# Patient Record
Sex: Female | Born: 1950 | Race: White | Hispanic: No | Smoking: Former smoker
Health system: Southern US, Community
[De-identification: ages and names within clinical notes are randomized; demographics above are authoritative.]

## PROBLEM LIST (undated history)

## (undated) DIAGNOSIS — F329 Major depressive disorder, single episode, unspecified: Secondary | ICD-10-CM

## (undated) DIAGNOSIS — F32A Depression, unspecified: Secondary | ICD-10-CM

## (undated) DIAGNOSIS — J449 Chronic obstructive pulmonary disease, unspecified: Secondary | ICD-10-CM

## (undated) DIAGNOSIS — I219 Acute myocardial infarction, unspecified: Secondary | ICD-10-CM

## (undated) DIAGNOSIS — D649 Anemia, unspecified: Secondary | ICD-10-CM

## (undated) DIAGNOSIS — F419 Anxiety disorder, unspecified: Secondary | ICD-10-CM

## (undated) DIAGNOSIS — I10 Essential (primary) hypertension: Secondary | ICD-10-CM

## (undated) HISTORY — DX: Essential (primary) hypertension: I10

## (undated) HISTORY — DX: Depression, unspecified: F32.A

## (undated) HISTORY — DX: Anxiety disorder, unspecified: F41.9

## (undated) HISTORY — DX: Acute myocardial infarction, unspecified: I21.9

## (undated) HISTORY — DX: Anemia, unspecified: D64.9

## (undated) HISTORY — DX: Chronic obstructive pulmonary disease, unspecified: J44.9

---
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
## 2019-02-12 ENCOUNTER — Encounter (INDEPENDENT_AMBULATORY_CARE_PROVIDER_SITE_OTHER): Payer: Self-pay

## 2019-02-12 ENCOUNTER — Other Ambulatory Visit: Payer: Self-pay

## 2019-02-12 ENCOUNTER — Encounter: Payer: Self-pay | Admitting: Nurse Practitioner

## 2019-02-12 ENCOUNTER — Ambulatory Visit (INDEPENDENT_AMBULATORY_CARE_PROVIDER_SITE_OTHER): Payer: Medicare Other | Admitting: Nurse Practitioner

## 2019-02-12 VITALS — BP 108/60 | HR 62 | Temp 98.2°F | Ht 58.5 in | Wt 138.0 lb

## 2019-02-12 DIAGNOSIS — G894 Chronic pain syndrome: Secondary | ICD-10-CM

## 2019-02-12 DIAGNOSIS — R5381 Other malaise: Secondary | ICD-10-CM

## 2019-02-12 DIAGNOSIS — F419 Anxiety disorder, unspecified: Secondary | ICD-10-CM

## 2019-02-12 DIAGNOSIS — I10 Essential (primary) hypertension: Secondary | ICD-10-CM

## 2019-02-12 DIAGNOSIS — I25119 Atherosclerotic heart disease of native coronary artery with unspecified angina pectoris: Secondary | ICD-10-CM | POA: Diagnosis not present

## 2019-02-12 DIAGNOSIS — J449 Chronic obstructive pulmonary disease, unspecified: Secondary | ICD-10-CM

## 2019-02-12 DIAGNOSIS — D508 Other iron deficiency anemias: Secondary | ICD-10-CM

## 2019-02-12 DIAGNOSIS — R634 Abnormal weight loss: Secondary | ICD-10-CM

## 2019-02-12 NOTE — Patient Instructions (Addendum)
To titrate off hydrocodone over the next 2 weeks To use tylenol 325 2 tablets every 6 hours as needed for pain  To decrease buspar to daily for 3 days then stop To call therapist/counselor   Follow up in 2 weeks  Make sure to sign record release from previous PCP

## 2019-02-12 NOTE — Progress Notes (Signed)
Careteam: Patient Care Team: System, Pcp Not In as PCP - General  Advanced Directive information    No Known Allergies  Chief Complaint  Patient presents with  . Establish Care    New patient establish care. Patient c/o sinus issues. Possible kidney infection, pressure when urinating, and pelvic pain. Patient tried AZO. Here with daughter Herbert SetaHeather   . Shortness of Breath    Patient c/o breathing issues, anxiety, and hullations   . Medication Management    Not sure of Spiriva dose   . Referral    Need orders for [facility]      HPI: Patient is a 68 y.o. female seen in the office today to establish care  CAD/ htn/recently hospitalized for MI and had stents 01/20/2019, continues ASA 81 mg daily, lipitor 80 mg by mouth daily which was recently increased. Continues on metoprolol 25 mg BID, ticarelor 90 mg twice daily- also plans to keep going to [place] for her specialist. Having weakness and increase generalized pain.   Advanced COPD- following with pulmonary doctor in [place]  does not plan on establishing with provider here. Continues on symbicort 2 puffs twice daily with spiriva daily. Maintained on 4L [place]  Panic attack/anxiety- anxiety not controlled, taking lexapro 20 mg daily and buspar 7.5 mg twice daily.  Has been on lexapro for about 4 years and buspar started 7/19 she was previously on clonipine.  Does not feel like the buspar is offering any benefit.  resp therapist have given deep breathing exercises to her in the hospital which was beneficial   Anemia- currently taking iron supplement twice daily - poor dietary intake.   GERD- stable on protonix    taking hydrocodone- APAP 1 tablet every 12 hours as needed for pain. Takes routinely twice daily and feels like she needs it more for her breathing. daughter reports family hx of addiction and feeling like they need a lot of medication. She has "generalized pain" but can not say where she is hurting exactly.   Daughter feels like she is between a rock and a hard place, her biggest concern is that she is on too many medication to depress her breathing and does not need the hydrocodone.  Reports she had pressure when urinating several days ago but this has resolved. No pain, frequency or urgency noted.   Review of Systems:  Review of Systems  Constitutional: Positive for malaise/fatigue and weight loss. Negative for chills and fever.  HENT: Positive for hearing loss. Negative for tinnitus.   Respiratory: Positive for shortness of breath and wheezing. Negative for cough and sputum production.   Cardiovascular: Negative for chest pain, palpitations and leg swelling.  Gastrointestinal: Negative for abdominal pain, constipation, diarrhea and heartburn.  Genitourinary: Negative for dysuria, frequency and urgency.  Musculoskeletal: Positive for myalgias. Negative for back pain, falls and joint pain.  Skin: Negative.   Neurological: Negative for dizziness and headaches.  Psychiatric/Behavioral: Positive for depression. Negative for memory loss. The patient is nervous/anxious. The patient does not have insomnia.     Past Medical History:  Diagnosis Date  . Anemia   . Anxiety   . COPD (chronic obstructive pulmonary disease) (HCC)    Stage 4  . Depression   . Heart attack (HCC)   . High blood pressure    History reviewed. No pertinent surgical history. Social History:   reports that she has quit smoking. Her smoking use included cigarettes. She has a 30.00 pack-year smoking history. She has never  used smokeless tobacco. She reports previous alcohol use of about 4.0 - 5.0 standard drinks of alcohol per week. She reports that she does not use drugs.  History reviewed. No pertinent family history.  Medications: Patient's Medications  New Prescriptions   No medications on file  Previous Medications   ASPIRIN EC 81 MG TABLET    Take 81 mg by mouth daily.   ATORVASTATIN (LIPITOR) 80 MG TABLET     Take 80 mg by mouth at bedtime.   BUDESONIDE-FORMOTEROL (SYMBICORT) 160-4.5 MCG/ACT INHALER    Inhale 2 puffs into the lungs 2 (two) times daily.   BUSPIRONE (BUSPAR) 7.5 MG TABLET    Take 7.5 mg by mouth 2 (two) times daily.   ESCITALOPRAM (LEXAPRO) 20 MG TABLET    Take 20 mg by mouth daily.   FERROUS SULFATE 325 (65 FE) MG EC TABLET    Take 325 mg by mouth 2 (two) times daily.   HYDROCODONE-ACETAMINOPHEN (NORCO/VICODIN) 5-325 MG TABLET    Take 1 tablet by mouth 2 (two) times daily.   METOPROLOL SUCCINATE (TOPROL-XL) 25 MG 24 HR TABLET    Take 25 mg by mouth 2 (two) times daily.   OXYGEN    Inhale 4 L into the lungs continuous. On x 5-6 years as of 2020   PANTOPRAZOLE (PROTONIX) 40 MG TABLET    Take 40 mg by mouth daily.   TICAGRELOR (BRILINTA) 90 MG TABS TABLET    Take 90 mg by mouth 2 (two) times daily.   TIOTROPIUM BROMIDE MONOHYDRATE (SPIRIVA HANDIHALER IN)    Inhale into the lungs.   VITAMIN C (ASCORBIC ACID) 500 MG TABLET    Take 500 mg by mouth daily.  Modified Medications   No medications on file  Discontinued Medications   No medications on file    Physical Exam:  Vitals:   02/12/19 1406  BP: 108/60  Pulse: 62  Temp: 98.2 F (36.8 C)  TempSrc: Oral  Weight: 138 lb (62.6 kg)  Height: 4' 10.5" (1.486 m)   Body mass index is 28.35 kg/m. Wt Readings from Last 3 Encounters:  02/12/19 138 lb (62.6 kg)    Physical Exam Constitutional:      General: She is not in acute distress.    Appearance: She is well-developed. She is not diaphoretic.  HENT:     Head: Normocephalic and atraumatic.  Eyes:     Conjunctiva/sclera: Conjunctivae normal.     Pupils: Pupils are equal, round, and reactive to light.  Neck:     Musculoskeletal: Normal range of motion and neck supple.  Cardiovascular:     Rate and Rhythm: Normal rate and regular rhythm.     Heart sounds: Normal heart sounds.  Pulmonary:     Effort: Pulmonary effort is normal.     Breath sounds: Decreased breath sounds  present.  Abdominal:     General: Bowel sounds are normal.     Palpations: Abdomen is soft.  Musculoskeletal:        General: No tenderness.     Right lower leg: No edema.     Left lower leg: No edema.  Skin:    General: Skin is warm and dry.  Neurological:     Mental Status: She is alert and oriented to person, place, and time.     Labs reviewed: Basic Metabolic Panel: No results for input(s): NA, K, CL, CO2, GLUCOSE, BUN, CREATININE, CALCIUM, MG, PHOS, TSH in the last 8760 hours. Liver Function Tests: No results for  input(s): AST, ALT, ALKPHOS, BILITOT, PROT, ALBUMIN in the last 8760 hours. No results for input(s): LIPASE, AMYLASE in the last 8760 hours. No results for input(s): AMMONIA in the last 8760 hours. CBC: No results for input(s): WBC, NEUTROABS, HGB, HCT, MCV, PLT in the last 8760 hours. Lipid Panel: No results for input(s): CHOL, HDL, LDLCALC, TRIG, CHOLHDL, LDLDIRECT in the last 8760 hours. TSH: No results for input(s): TSH in the last 8760 hours. A1C: No results found for: HGBA1C   Assessment/Plan 1. Coronary artery disease involving native heart with angina pectoris, unspecified vessel or lesion type [facility]) Recent MI with stents last month, after hospitalization daughter realized she could not take care of herself and has moved her to [employer]. She plans to keep follow up with former cardiologist  -continues on ASA, metoprolol succinate, brilinta and lipitor - Ambulatory referral to [place] GFR - CBC with Differential/Platelet - TSH  2. Debility - Ambulatory referral to Home Health  3. Essential hypertension -controlled on current regimen - Ambulatory referral to [place]  4. Stage 4 very severe COPD by GOLD classification ([place]) Instructed to use spiriva and symbicort as prescribed  5. Anxiety -not controlled. She was taken off klonopin abruptly after recent hospitalization. Daughter states she has had some  withdrawal from this. She was started on buspar which family and pt feel like is not beneficial. To decrease this to daily for 1 week then stop.  Will likely need to change lexapro but will address this in 2 weeks. -counseling services recommended and information provided.  - COMPLETE METABOLIC PANEL WITH GFR - CBC with Differential/Platelet - TSH  6. Weight loss Weight loss noted, she does not eat a significant amount of calories and does not like nutritional supplements, now living with daughter who is encouraging her to eat better. Can also try resource nutritional supplement which is more juice vs milk based.   7. Chronic pain syndrome Generalized pain however does not have a specific joint or muscle that hurts. States she takes hydrocodone/apap due to "lung pain" due to her severe COPD and risk for respiratory depression will titration hydrocodone/apap, encouraged to cut back to 1 tablet daily x 1 week then to decrease to 1/2 tablet daily for 4 days then to take every other day for 4 days then stop.  May use tylenol 650 mg by mouth every 6 hours as needed   8. Iron deficiency anemia secondary to inadequate dietary iron intake -continues on iron supplement, will follow up CBC -continues on iron.   Next appt: 2 weeks, sooner if needed [place] Claud K. [place], [facility] [phone]

## 2019-02-13 ENCOUNTER — Other Ambulatory Visit: Payer: Self-pay

## 2019-02-13 ENCOUNTER — Inpatient Hospital Stay (HOSPITAL_COMMUNITY)
Admission: EM | Admit: 2019-02-13 | Discharge: 2019-02-15 | DRG: 191 | Disposition: A | Discharge status: Home Health | Payer: Medicare Other | Attending: Student | Admitting: Student

## 2019-02-13 ENCOUNTER — Emergency Department (HOSPITAL_COMMUNITY): Payer: Medicare Other

## 2019-02-13 ENCOUNTER — Encounter (HOSPITAL_COMMUNITY): Payer: Self-pay

## 2019-02-13 DIAGNOSIS — Z7982 Long term (current) use of aspirin: Secondary | ICD-10-CM

## 2019-02-13 DIAGNOSIS — I252 Old myocardial infarction: Secondary | ICD-10-CM

## 2019-02-13 DIAGNOSIS — F419 Anxiety disorder, unspecified: Secondary | ICD-10-CM | POA: Diagnosis present

## 2019-02-13 DIAGNOSIS — R040 Epistaxis: Secondary | ICD-10-CM | POA: Diagnosis not present

## 2019-02-13 DIAGNOSIS — E785 Hyperlipidemia, unspecified: Secondary | ICD-10-CM

## 2019-02-13 DIAGNOSIS — D649 Anemia, unspecified: Secondary | ICD-10-CM

## 2019-02-13 DIAGNOSIS — I444 Left anterior fascicular block: Secondary | ICD-10-CM | POA: Diagnosis present

## 2019-02-13 DIAGNOSIS — G4733 Obstructive sleep apnea (adult) (pediatric): Secondary | ICD-10-CM | POA: Diagnosis present

## 2019-02-13 DIAGNOSIS — I10 Essential (primary) hypertension: Secondary | ICD-10-CM | POA: Diagnosis present

## 2019-02-13 DIAGNOSIS — J9610 Chronic respiratory failure, unspecified whether with hypoxia or hypercapnia: Secondary | ICD-10-CM | POA: Diagnosis present

## 2019-02-13 DIAGNOSIS — I2510 Atherosclerotic heart disease of native coronary artery without angina pectoris: Secondary | ICD-10-CM

## 2019-02-13 DIAGNOSIS — Z955 Presence of coronary angioplasty implant and graft: Secondary | ICD-10-CM

## 2019-02-13 DIAGNOSIS — G4730 Sleep apnea, unspecified: Secondary | ICD-10-CM

## 2019-02-13 DIAGNOSIS — K219 Gastro-esophageal reflux disease without esophagitis: Secondary | ICD-10-CM | POA: Diagnosis present

## 2019-02-13 DIAGNOSIS — R509 Fever, unspecified: Secondary | ICD-10-CM

## 2019-02-13 DIAGNOSIS — E876 Hypokalemia: Secondary | ICD-10-CM

## 2019-02-13 DIAGNOSIS — D509 Iron deficiency anemia, unspecified: Secondary | ICD-10-CM | POA: Diagnosis present

## 2019-02-13 DIAGNOSIS — Z9981 Dependence on supplemental oxygen: Secondary | ICD-10-CM

## 2019-02-13 DIAGNOSIS — Z87891 Personal history of nicotine dependence: Secondary | ICD-10-CM

## 2019-02-13 DIAGNOSIS — F329 Major depressive disorder, single episode, unspecified: Secondary | ICD-10-CM | POA: Diagnosis present

## 2019-02-13 DIAGNOSIS — Z7951 Long term (current) use of inhaled steroids: Secondary | ICD-10-CM

## 2019-02-13 DIAGNOSIS — J441 Chronic obstructive pulmonary disease with (acute) exacerbation: Principal | ICD-10-CM

## 2019-02-13 DIAGNOSIS — D539 Nutritional anemia, unspecified: Secondary | ICD-10-CM | POA: Diagnosis present

## 2019-02-13 DIAGNOSIS — Z20828 Contact with and (suspected) exposure to other viral communicable diseases: Secondary | ICD-10-CM | POA: Diagnosis present

## 2019-02-13 DIAGNOSIS — R911 Solitary pulmonary nodule: Secondary | ICD-10-CM | POA: Diagnosis present

## 2019-02-13 DIAGNOSIS — Z79899 Other long term (current) drug therapy: Secondary | ICD-10-CM

## 2019-02-13 LAB — BASIC METABOLIC PANEL
Anion gap: 10 (ref 5–15)
BUN: 13 mg/dL (ref 8–23)
CO2: 38 mmol/L — ABNORMAL HIGH (ref 22–32)
Calcium: 9 mg/dL (ref 8.9–10.3)
Chloride: 94 mmol/L — ABNORMAL LOW (ref 98–111)
Creatinine, Ser: 0.65 mg/dL (ref 0.44–1.00)
GFR calc Af Amer: >60 mL/min (ref >60)
GFR calc non Af Amer: >60 mL/min (ref >60)
Glucose, Bld: 146 mg/dL — ABNORMAL HIGH (ref 70–99)
Potassium: 3.2 mmol/L — ABNORMAL LOW (ref 3.5–5.1)
Sodium: 142 mmol/L (ref 135–145)

## 2019-02-13 LAB — COMPLETE METABOLIC PANEL WITH GFR
AG Ratio: 1.4 (calc) (ref 1.0–2.5)
ALT: 16 U/L (ref 6–29)
AST: 17 U/L (ref 10–35)
Albumin: 3.6 g/dL (ref 3.6–5.1)
Alkaline phosphatase (APISO): 84 U/L (ref 37–153)
BUN: 15 mg/dL (ref 7–25)
CO2: 37 mmol/L — ABNORMAL HIGH (ref 20–32)
Calcium: 9.2 mg/dL (ref 8.6–10.4)
Chloride: 94 mmol/L — ABNORMAL LOW (ref 98–110)
Creat: 0.68 mg/dL (ref 0.50–0.99)
GFR, Est African American: 104 mL/min/{1.73_m2} (ref >=60)
GFR, Est Non African American: 90 mL/min/{1.73_m2} (ref >=60)
Globulin: 2.5 g/dL (calc) (ref 1.9–3.7)
Glucose, Bld: 129 mg/dL — ABNORMAL HIGH (ref 65–99)
Potassium: 3.7 mmol/L (ref 3.5–5.3)
Sodium: 140 mmol/L (ref 135–146)
Total Bilirubin: 0.7 mg/dL (ref 0.2–1.2)
Total Protein: 6.1 g/dL (ref 6.1–8.1)

## 2019-02-13 LAB — CBC WITH DIFFERENTIAL/PLATELET
Abs Immature Granulocytes: 0.05 10*3/uL (ref 0.00–0.07)
Absolute Monocytes: 801 cells/uL (ref 200–950)
Basophils Absolute: 23 cells/uL (ref 0–200)
Basophils Absolute: 0 10*3/uL (ref 0.0–0.1)
Basophils Relative: 0.3 %
Basophils Relative: 0 %
Eosinophils Absolute: 23 cells/uL (ref 15–500)
Eosinophils Absolute: 0 10*3/uL (ref 0.0–0.5)
Eosinophils Relative: 0.3 %
Eosinophils Relative: 0 %
HCT: 29.2 % — ABNORMAL LOW (ref 35.0–45.0)
HCT: 31.4 % — ABNORMAL LOW (ref 36.0–46.0)
Hemoglobin: 8.9 g/dL — ABNORMAL LOW (ref 11.7–15.5)
Hemoglobin: 8.9 g/dL — ABNORMAL LOW (ref 12.0–15.0)
Immature Granulocytes: 1 %
Lymphocytes Relative: 4 %
Lymphs Abs: 285 cells/uL — ABNORMAL LOW (ref 850–3900)
Lymphs Abs: 0.3 10*3/uL — ABNORMAL LOW (ref 0.7–4.0)
MCH: 28.3 pg (ref 27.0–33.0)
MCH: 28.6 pg (ref 26.0–34.0)
MCHC: 30.5 g/dL — ABNORMAL LOW (ref 32.0–36.0)
MCHC: 28.3 g/dL — ABNORMAL LOW (ref 30.0–36.0)
MCV: 93 fL (ref 80.0–100.0)
MCV: 101 fL — ABNORMAL HIGH (ref 80.0–100.0)
MPV: 11.5 fL (ref 7.5–12.5)
Monocytes Absolute: 0.7 10*3/uL (ref 0.1–1.0)
Monocytes Relative: 10.4 %
Monocytes Relative: 9 %
Neutro Abs: 6568 cells/uL (ref 1500–7800)
Neutro Abs: 6.8 10*3/uL (ref 1.7–7.7)
Neutrophils Relative %: 85.3 %
Neutrophils Relative %: 86 %
Platelets: 323 10*3/uL (ref 140–400)
Platelets: 290 10*3/uL (ref 150–400)
RBC: 3.14 10*6/uL — ABNORMAL LOW (ref 3.80–5.10)
RBC: 3.11 MIL/uL — ABNORMAL LOW (ref 3.87–5.11)
RDW: 14.7 % (ref 11.0–15.0)
RDW: 16.3 % — ABNORMAL HIGH (ref 11.5–15.5)
Total Lymphocyte: 3.7 %
WBC: 7.7 10*3/uL (ref 3.8–10.8)
WBC: 7.9 10*3/uL (ref 4.0–10.5)
nRBC: 0 % (ref 0.0–0.2)

## 2019-02-13 LAB — BRAIN NATRIURETIC PEPTIDE: B Natriuretic Peptide: 50.8 pg/mL (ref 0.0–100.0)

## 2019-02-13 LAB — PROTIME-INR
INR: 0.9 (ref 0.8–1.2)
Prothrombin Time: 11.8 seconds (ref 11.4–15.2)

## 2019-02-13 LAB — TROPONIN I (HIGH SENSITIVITY): Troponin I (High Sensitivity): 12 ng/L (ref <18)

## 2019-02-13 LAB — SARS CORONAVIRUS 2 BY RT PCR (HOSPITAL ORDER, PERFORMED IN ~~LOC~~ HOSPITAL LAB): SARS Coronavirus 2: NEGATIVE

## 2019-02-13 LAB — TSH: TSH: 0.94 mIU/L (ref 0.40–4.50)

## 2019-02-13 MED ORDER — OXYMETAZOLINE HCL 0.05 % NA SOLN
1.0000 | Freq: Once | NASAL | Status: AC
  Administered 2019-02-13: 1 via NASAL

## 2019-02-13 MED ORDER — IPRATROPIUM BROMIDE HFA 17 MCG/ACT IN AERS
2.0000 | INHALATION_SPRAY | Freq: Once | RESPIRATORY_TRACT | Status: AC
  Administered 2019-02-13: 2 via RESPIRATORY_TRACT
  Filled 2019-02-13: qty 12.9

## 2019-02-13 MED ORDER — TRANEXAMIC ACID 1000 MG/10ML IV SOLN
500.0000 mg | Freq: Once | INTRAVENOUS | Status: AC
  Administered 2019-02-13: 18:00:00 500 mg via TOPICAL
  Filled 2019-02-13: qty 10

## 2019-02-13 MED ORDER — POTASSIUM CHLORIDE CRYS ER 20 MEQ PO TBCR
40.0000 meq | EXTENDED_RELEASE_TABLET | Freq: Once | ORAL | Status: AC
  Administered 2019-02-13: 40 meq via ORAL
  Filled 2019-02-13: qty 2

## 2019-02-13 MED ORDER — PREDNISONE 20 MG PO TABS: 40.0000 mg | ORAL_TABLET | Freq: Every day | ORAL | 0 refills | Status: DC

## 2019-02-13 MED ORDER — SILVER NITRATE-POT NITRATE 75-25 % EX MISC
CUTANEOUS | Status: AC
  Administered 2019-02-13: 1 via TOPICAL
  Filled 2019-02-13: qty 1

## 2019-02-13 MED ORDER — ALBUTEROL SULFATE HFA 108 (90 BASE) MCG/ACT IN AERS
2.0000 | INHALATION_SPRAY | Freq: Four times a day (QID) | RESPIRATORY_TRACT | Status: DC
  Administered 2019-02-13: 2 via RESPIRATORY_TRACT
  Filled 2019-02-13: qty 6.7

## 2019-02-13 MED ORDER — ALBUTEROL SULFATE HFA 108 (90 BASE) MCG/ACT IN AERS
2.0000 | INHALATION_SPRAY | Freq: Once | RESPIRATORY_TRACT | Status: AC
  Administered 2019-02-13: 2 via RESPIRATORY_TRACT

## 2019-02-13 MED ORDER — METHYLPREDNISOLONE SODIUM SUCC 40 MG IJ SOLR
40.0000 mg | Freq: Two times a day (BID) | INTRAMUSCULAR | Status: DC
  Filled 2019-02-13: qty 1

## 2019-02-13 MED ORDER — METHYLPREDNISOLONE SODIUM SUCC 125 MG IJ SOLR
125.0000 mg | Freq: Once | INTRAMUSCULAR | Status: AC
  Administered 2019-02-13: 125 mg via INTRAVENOUS
  Filled 2019-02-13: qty 2

## 2019-02-13 MED ORDER — ALBUTEROL SULFATE HFA 108 (90 BASE) MCG/ACT IN AERS
2.0000 | INHALATION_SPRAY | Freq: Once | RESPIRATORY_TRACT | Status: AC
  Administered 2019-02-13: 2 via RESPIRATORY_TRACT
  Filled 2019-02-13: qty 6.7

## 2019-02-13 MED ORDER — SILVER NITRATE-POT NITRATE 75-25 % EX MISC
1.0000 | Freq: Once | CUTANEOUS | Status: AC
  Administered 2019-02-13: 1 via TOPICAL

## 2019-02-13 MED ORDER — OXYMETAZOLINE HCL 0.05 % NA SOLN
NASAL | Status: AC
  Filled 2019-02-13: qty 30

## 2019-02-13 MED ORDER — DM-GUAIFENESIN ER 30-600 MG PO TB12: 1.0000 | ORAL_TABLET | Freq: Two times a day (BID) | ORAL | Status: DC

## 2019-02-13 NOTE — ED Notes (Signed)
Patient daughter here to pick up patient with home oxygen. Patient nose still bleeding, despite pressure. Patient taken to the bathroom via wheelchair by tech. Patient reports when she gets back to the room that she "cant breathe." Patient given puff of PRN inhalers and pressure held to patient nose for bleeding. Patient spitting out clots. ED PA Mina ordered Afrin for patient nose bleed. MD made aware and to see patient shortly.

## 2019-02-13 NOTE — H&P (Signed)
History and Physical  Debra BruinsBrenda Mcgee ZOX:[id number] DOB: 1950-07-13 DOA: 02/13/2019  Referring physician:Dykstra, Gerlene Burdockichard PCP: System, Pcp Not In  Patient coming from: Home & is able to ambulate  Chief Complaint: Shortness of breath  HPI: Debra BruinsBrenda Mcgee is a 68 y.o. female with medical history significant for COPD on 4 LPM of oxygen at baseline, coronary artery disease s/p stent placement (July 2020), OSA on CPAP, hypertension and GERD who presents to the emergency department accompanied by daughter with complaint of shortness of breath and frequent nosebleeds.  Patient states that she usually have nosebleeds occasionally due to dry nose caused by oxygen and CPAP use.  She states that she had a stent placed last month in a hospital in [place] due to a heart attack and she was placed on blood thinner, patient states that she has had more frequent nose bleeds since onset of taking the medication.  Nosebleed was worse this morning and she noted increased wheezing making her think that her COPD is flaring up.  Patient states that she ran out of her albuterol rescue inhaler, so she came to the ED for further evaluation and management.  She denies fever, chills, cough, sputum production or sick contact.  ED Course:  In the emergency department, BP was 132/60 and other vital signs were within normal range.  Work-up in the ED showed anemia (macrocytic) and hypokalemia.  Nasal pressure was applied on nose with subsequent nosebleed control.  She was on antiplatelet medication due to recent stent placement.  Patient was unable to tolerate nasal packing due to worsened breathing status and increased work of breathing.  Breathing treatment was provided, however since patient continued to wheeze despite breathing treatment, it was decided for patient to be observed overnight with further breathing treatment.  ENT (Dr. Jearld FentonByers) was already consulted due to patient's nosebleed per ED physician.  Review of  Systems: Constitutional: Negative for chills and fever.  HENT: Positive for nosebleed. Negative for ear pain and sore throat.   Eyes: Negative for pain and visual disturbance.  Respiratory: Positive for shortness of breath and wheezing. Negative for cough Cardiovascular: Negative for chest pain and palpitations.  Gastrointestinal: Negative for abdominal pain and vomiting.  Endocrine: Negative for polyphagia and polyuria.  Genitourinary: Negative for decreased urine volume, dysuria.  Musculoskeletal: Negative for arthralgias and back pain.  Skin: Negative for color change and rash.  Allergic/Immunologic: Negative for immunocompromised state.  Neurological: Negative for tremors, syncope, speech difficulty, weakness, light-headedness and headaches.  Hematological: Does not bruise/bleed easily.  All other systems reviewed and are negative  Past Medical History:  Diagnosis Date  . Anemia   . Anxiety   . COPD (chronic obstructive pulmonary disease) (HCC)    Stage 4  . Depression   . Heart attack (HCC)   . High blood pressure    History reviewed. No pertinent surgical history.  Social History:  reports that she has quit smoking. Her smoking use included cigarettes. She has a 48.00 pack-year smoking history. She has never used smokeless tobacco. She reports previous alcohol use of about 4.0 - 5.0 standard drinks of alcohol per week. She reports that she does not use drugs.   No Known Allergies  History reviewed. No pertinent family history.    Prior to Admission medications   Medication Sig Start Date End Date Taking? Authorizing Provider  aspirin EC 81 MG tablet Take 81 mg by mouth daily.   Yes [provider]  atorvastatin (LIPITOR) 80 MG tablet Take 80 mg  by mouth at bedtime.   Yes [provider]  budesonide-formoterol (SYMBICORT) 160-4.5 MCG/ACT inhaler Inhale 2 puffs into the lungs 2 (two) times daily.   Yes [provider]  escitalopram (LEXAPRO) 20  MG tablet Take 20 mg by mouth daily.   Yes [provider]  ferrous sulfate 325 (65 FE) MG EC tablet Take 325 mg by mouth 2 (two) times daily.   Yes [provider]  metoprolol succinate (TOPROL-XL) 25 MG 24 hr tablet Take 25 mg by mouth 2 (two) times daily.   Yes [provider]  pantoprazole (PROTONIX) 40 MG tablet Take 40 mg by mouth daily.   Yes [provider]  ticagrelor (BRILINTA) 90 MG TABS tablet Take 90 mg by mouth 2 (two) times daily.   Yes [provider]  Tiotropium Bromide Monohydrate (SPIRIVA HANDIHALER IN) Inhale 1 puff into the lungs daily.    Yes [provider]  vitamin C (ASCORBIC ACID) 500 MG tablet Take 500 mg by mouth daily.   Yes [provider]  OXYGEN Inhale 4 L into the lungs continuous. On x 5-6 years as of 2020    [provider]  predniSONE (DELTASONE) 20 MG tablet Take 2 tablets (40 mg total) by mouth daily for 5 days. 02/13/19 02/18/19  Lucrezia Starch, MD    Physical Exam: BP 132/63   Pulse 88   Temp 99.8 F (37.7 C) (Rectal)   Resp 15   SpO2 98%   . General: 68 y.o. year-old female  in no acute distress.  Alert and oriented x3. Marland Kitchen HENT: Nasal pressure on nasal bridge noted.  Normocephalic, atraumatic, moist mucous membrane . Eyes: Conjunctival normal, sclera anicteric . Neck: Supple, trachea medial . Cardiovascular: Regular rate and rhythm with no rubs or gallops.  No thyromegaly or JVD noted.  No lower extremity edema. 2/4 pulses in all 4 extremities. Marland Kitchen Respiratory: Mild increased work of breathing, bilateral expiratory wheezing.   . Abdomen: Soft nontender nondistended with normal bowel sounds x4 quadrants. . Muskuloskeletal: No cyanosis, clubbing or edema noted bilaterally . Neuro: CN II-XII intact, strength, sensation, reflexes . Skin: No ulcerative lesions noted or rashes . Psychiatry: Judgement and insight appear normal. Mood is appropriate for condition and setting           Labs on Admission:  Basic Metabolic Panel: Recent Labs  Lab 02/12/19 1457 02/13/19 1209  NA 140 142  K 3.7 3.2*  CL 94* 94*  CO2 37* 38*  GLUCOSE 129* 146*  BUN 15 13  CREATININE 0.68 0.65  CALCIUM 9.2 9.0   Liver Function Tests: Recent Labs  Lab 02/12/19 1457  AST 17  ALT 16  BILITOT 0.7  PROT 6.1   No results for input(s): LIPASE, AMYLASE in the last 168 hours. No results for input(s): AMMONIA in the last 168 hours. CBC: Recent Labs  Lab 02/12/19 1457 02/13/19 1209  WBC 7.7 7.9  NEUTROABS 6,568 6.8  HGB 8.9* 8.9*  HCT 29.2* 31.4*  MCV 93.0 101.0*  PLT 323 290   Cardiac Enzymes: No results for input(s): CKTOTAL, CKMB, CKMBINDEX, TROPONINI in the last 168 hours.  BNP (last 3 results) Recent Labs    02/13/19 1209  BNP 50.8    ProBNP (last 3 results) No results for input(s): PROBNP in the last 8760 hours.  CBG: No results for input(s): GLUCAP in the last 168 hours.  Radiological Exams on Admission: Dg Chest Portable 1 View  Result Date: 02/13/2019 CLINICAL DATA:  Shortness of breath and fever EXAM: PORTABLE CHEST 1 VIEW COMPARISON:  None. FINDINGS: There is mild cardiomegaly. There is atherosclerotic calcification the aortic knob with a tortuous descending aorta. 8 mm nodular opacity seen overlying the lateral aspect of the lower left lung. The right lung is clear. IMPRESSION: 1. 8 mm nodular opacity seen overlying the left mid lung. If further evaluation is required would recommend CT chest with contrast. 2. Mild cardiomegaly Electronically Signed   By: Jonna ClarkBindu  Avutu M.D.   On: 02/13/2019 12:33    EKG: I independently viewed the EKG done and my findings are as followed: Normal sinus rhythm at a rate of 80bpm  Assessment/Plan Present on Admission: . COPD with acute exacerbation (HCC)  Principal Problem:   COPD with acute exacerbation (HCC) Active Problems:   Epistaxis   Anemia   Coronary artery disease   GERD (gastroesophageal reflux disease)    Essential hypertension   Dyslipidemia   Sleep apnea   Hypokalemia  Acute exacerbation of COPD Continue Albuterol Mucinex, Solu-Medrol. Continue Protonix to prevent steroid-induced ulcer Continue incentive spirometry and flutter valve Continue supplemental oxygen to maintain O2 sat > 92% with plan to wean patient off oxygen as tolerated  Epistaxis Currently controlled with nasal pressure ENT (Dr.Byers) already notified per ED physician  Hypokalemia K+ 3.2; this will be replenished  Coronary artery disease s/p stent placement Continue aspirin and Brilinta per home regimen Continue home metoprolol and statin  GERD Continue Protonix  Essential hypertension Continue metoprolol per home regimen  Dyslipidemia Continue home statin  Sleep apnea Continue CPAP as tolerated (considering patient's nosebleed)  Anemia (mixed) Patient has a history of iron deficiency anemia; CBC showed macrocytic anemia Continue ferrous sulfate per home regimen Vitamin B12 and folate level will be checked    DVT prophylaxis: SCDs  Code Status: Full code  Family Communication: Daughter at bedside  Disposition Plan: Home once clinically improved  Consults called: ENT (Dr.Byers) per ED physician  Admission status: Observation    Frankey Shownladapo Quanika Solem MD [facility]  If 7PM-7AM, please contact night-coverage [email or web address] 02/13/2019, 9:23 PM

## 2019-02-13 NOTE — Discharge Instructions (Signed)
Recommend again the steroid for your COPD exacerbation.  Recommend take your inhaler as needed for further wheezing.  If you feel that your shortness of breath is worsening, you develop fever, chest pain or other new concerning symptoms please return to the ER for reassessment.  Recommend following up with your primary doctor for recheck early next week.

## 2019-02-13 NOTE — TOC Initial Note (Signed)
Transition of Care [facility]) - Initial/Assessment Note    Patient Details  Name: Debra Mcgee MRN: [id number] Date of Birth: 06/20/1951  Transition of Care [facility]) CM/SW Contact:    Debra Hoard, LCSW Phone Number: 02/13/2019, 2:39 PM  Clinical Narrative:                 CSW spoke with patient's daughter, Debra Mcgee, about patient's needs for [facility]. Debra Mcgee reports patient is originally from [place], but recently moved to [place] to live with her. Patient does not have a PCP here. Debra Mcgee reports they met with a doctor in the  area to get [facility] orders started for her mother, but they did not have a good experience with the provider.   ED RNCM Debra Mcgee assisted with getting patient at appointment at [facility]. ED RNCM also made referral for [facility] with [facility]. Debra Mcgee, but they would not have been able to see patient until she established care with a PCP.  Expected Discharge Plan: [place] Barriers to Discharge: No Barriers Identified   Patient Goals and CMS Choice Patient states their goals for this hospitalization and ongoing recovery are:: Per daughter, to be able to care for patient at home CMS Medicare.gov Compare Post Acute Care list provided to:: Patient Represenative (must comment)(Daughter) Choice offered to / list presented to : Adult Children  Expected Discharge Plan and Services Expected Discharge Plan: [place] Choice: [place] arrangements for the past 2 months: Single Family Home                           HH Arranged: RN, PT, OT, Nurse's Aide, Disease Management [place] Agency: [facility] (now [facility]) Date [place]: 02/13/19 Time [place]: 1432 Representative spoke with at [place]: Debra Mcgee  Prior Living Arrangements/Services Living arrangements for the past 2 months: [place] with::  Adult Children Patient language and need for interpreter reviewed:: Yes Do you feel safe going back to the place where you live?: Yes      Need for Family Participation in Patient Care: Yes (Comment) Care giver support system in place?: Yes (comment)   Criminal Activity/Legal Involvement Pertinent to Current Situation/Hospitalization: No - Comment as needed  Activities of Daily Living      Permission Sought/Granted Permission sought to share information with : Facility Sport and exercise psychologist, Family Supports Permission granted to share information with : Yes, Verbal Permission Granted  Share Information with NAME: Debra Mcgee  Permission granted to share info w AGENCY: [place] granted to share info w Relationship: daughter  Permission granted to share info w Contact Information: Debra Mcgee (daughter) [phone]  Emotional Assessment Appearance:: Other (Comment Required(unable to assess) Attitude/Demeanor/Rapport: Unable to Assess Affect (typically observed): Unable to Assess Orientation: : Oriented to Self, Oriented to Place, Oriented to  Time, Oriented to Situation      Admission diagnosis:  shob There are no active problems to display for this patient.  PCP:  System, Pcp Not In Pharmacy:   [facility], [place] - [address] AT [place] [address] Phone: [phone] Fax: [phone]     Social Determinants of Health (SDOH) Interventions    Readmission Risk Interventions No flowsheet data  found.

## 2019-02-13 NOTE — ED Notes (Signed)
MD at bedside. 

## 2019-02-13 NOTE — TOC Transition Note (Signed)
Transition of Care [facility]) - CM/SW Discharge Note   Patient Details  Name: Wynnie Pacetti MRN: [id number] Date of Birth: 11/21/1950  Transition of Care [facility]) CM/SW Contact:  Erenest Rasher, RN Phone Number: [phone]  02/13/2019, 4:00 PM   Clinical Narrative:     Spoke to pt and gave permission to speak to dtr, Nira Conn. Spoke to dtr to make aware of [employer] will be [facility]. Request for Medical Advisor, Dr Rockne Menghini to sign [facility] orders until pt establish with PCP, Dr Martinique on 02/24/2019 at 2:30 pm.   Final next level of care: [place] Barriers to Discharge: No Barriers Identified   Patient Goals and CMS Choice Patient states their goals for this hospitalization and ongoing recovery are:: Per daughter, to be able to care for patient at home CMS Medicare.gov Compare Post Acute Care list provided to:: Patient Represenative (must comment)(Daughter) Choice offered to / list presented to : Adult Children  Discharge Placement                       Discharge Plan and Services     Post Acute Care Choice: Home Health                    HH Arranged: RN, PT, OT, Nurse's Aide, Disease Management [place] Agency: [facility] (now [facility]) Date [place]: 02/13/19 Time [place]: 1432 Representative spoke with at [place]: Burlene Arnt  Social Determinants of Health (SDOH) Interventions     Readmission Risk Interventions No flowsheet data found.

## 2019-02-13 NOTE — ED Notes (Signed)
Pure wick has been placed. Suction set to 45mmHg.  

## 2019-02-13 NOTE — ED Provider Notes (Addendum)
[facility]-EMERGENCY DEPT Provider Note   CSN: [id number] Arrival date & time: 02/13/19  1124     History   Chief Complaint Chief Complaint  Patient presents with  . Shortness of Breath    HPI Debra Mcgee is a 68 y.o. female.  Has past medical history COPD, coronary artery disease, obstructive sleep apnea, hypertension presents emergency department with chief complaint shortness of breath as well as frequent nosebleeds.  Patient states over the past few days she has noted increased shortness of breath.  States concerned her COPD is flaring up.  States that she ran out of her albuterol rescue inhaler, has 1 in the mail but does not currently have one.  Denies increase cough, sputum production.  Denies sick contacts, fevers.  Uses 3 L nasal cannula at baseline.  Patient states due to her CPAP and oxygen use she frequently has nosebleeds and had one earlier today.  States this stopped spontaneously.  Patient reports a few weeks ago she was admitted for a possible heart attack and had a stent placed, this was performed at a hospital in [place].     HPI  Past Medical History:  Diagnosis Date  . Anemia   . Anxiety   . COPD (chronic obstructive pulmonary disease) (HCC)    Stage 4  . Depression   . Heart attack (HCC)   . High blood pressure     There are no active problems to display for this patient.   History reviewed. No pertinent surgical history.   OB History   No obstetric history on file.      Home Medications    Prior to Admission medications   Medication Sig Start Date End Date Taking? Authorizing Provider  aspirin EC 81 MG tablet Take 81 mg by mouth daily.    [provider]  atorvastatin (LIPITOR) 80 MG tablet Take 80 mg by mouth at bedtime.    [provider]  budesonide-formoterol (SYMBICORT) 160-4.5 MCG/ACT inhaler Inhale 2 puffs into the lungs 2 (two) times daily.    [provider]  escitalopram  (LEXAPRO) 20 MG tablet Take 20 mg by mouth daily.    [provider]  ferrous sulfate 325 (65 FE) MG EC tablet Take 325 mg by mouth 2 (two) times daily.    [provider]  metoprolol succinate (TOPROL-XL) 25 MG 24 hr tablet Take 25 mg by mouth 2 (two) times daily.    [provider]  OXYGEN Inhale 4 L into the lungs continuous. On x 5-6 years as of 2020    [provider]  pantoprazole (PROTONIX) 40 MG tablet Take 40 mg by mouth daily.    [provider]  ticagrelor (BRILINTA) 90 MG TABS tablet Take 90 mg by mouth 2 (two) times daily.    [provider]  Tiotropium Bromide Monohydrate (SPIRIVA HANDIHALER IN) Inhale into the lungs.    [provider]  vitamin C (ASCORBIC ACID) 500 MG tablet Take 500 mg by mouth daily.    [provider]    Family History History reviewed. No pertinent family history.  Social History Social History   Tobacco Use  . Smoking status: Former Smoker    Packs/day: 1.00    Years: 48.00    Pack years: 48.00    Types: Cigarettes  . Smokeless tobacco: Never Used  . Tobacco comment: Quit at age 68  Substance Use Topics  . Alcohol use: Not Currently    Alcohol/week: 4.0 -  5.0 standard drinks    Types: 4 - 5 Cans of beer per week  . Drug use: Never     Allergies   Patient has no known allergies.   Review of Systems Review of Systems  Constitutional: Negative for chills and fever.  HENT: Positive for nosebleeds. Negative for ear pain and sore throat.   Eyes: Negative for pain and visual disturbance.  Respiratory: Positive for shortness of breath. Negative for cough.   Cardiovascular: Negative for chest pain and palpitations.  Gastrointestinal: Negative for abdominal pain and vomiting.  Genitourinary: Negative for dysuria and hematuria.  Musculoskeletal: Negative for arthralgias and back pain.  Skin: Negative for color change and rash.  Neurological: Negative for seizures and  syncope.  All other systems reviewed and are negative.    Physical Exam Updated Vital Signs BP 132/60   Pulse 80   Temp 99.8 F (37.7 C) (Rectal)   Resp 14   SpO2 98%   Physical Exam Vitals signs and nursing note reviewed.  Constitutional:      General: She is not in acute distress.    Comments: Chronically ill-appearing, no acute distress  HENT:     Head: Normocephalic and atraumatic.  Eyes:     Conjunctiva/sclera: Conjunctivae normal.  Neck:     Musculoskeletal: Neck supple.  Cardiovascular:     Rate and Rhythm: Normal rate and regular rhythm.     Heart sounds: No murmur.  Pulmonary:     Effort: No respiratory distress.     Comments: Mild tachypnea, mild increased work of breathing, bilateral expiratory wheeze, prolonged expiratory phase Abdominal:     Palpations: Abdomen is soft.     Tenderness: There is no abdominal tenderness.  Skin:    General: Skin is warm and dry.      ED Treatments / Results  Labs (all labs ordered are listed, but only abnormal results are displayed) Labs Reviewed  CBC WITH DIFFERENTIAL/PLATELET  BASIC METABOLIC PANEL  BRAIN NATRIURETIC PEPTIDE  TROPONIN I (HIGH SENSITIVITY)    EKG None  Radiology No results found.  Procedures Procedures (including critical care time)  Medications Ordered in ED Medications - No data to display   Initial Impression / Assessment and Plan / ED Course  I have reviewed the triage vital signs and the nursing notes.  Pertinent labs & imaging results that were available during my care of the patient were reviewed by me and considered in my medical decision making (see chart for details).  Clinical Course as of Feb 14 27  Fri Feb 13, 2019  1230 Completed initial assessment   [RD]  1426 Rechecked, will dc   [RD]  2001 Discussed case with hospitalist - requests notify ENT in case patient worsens and needs more urgent intervention while admitted   [RD]  2002 Discussed with ENT - Jearld FentonByers -  recommends packing nose if needed   [RD]    Clinical Course User Index [RD] Milagros Lollykstra, Jakeem Grape S, MD       68 year old lady presents emergency department with shortness of breath, nosebleed.  On antiplatelet therapy for recent stent placement.  Nosebleed spontaneously resolved.  Discussed with patient will need to continue antiplatelet therapy given recent stent placement.  Patient agreeable, will continue monitoring for now.  Regarding shortness of breath, noted prolonged expiratory phase, expiratory wheeze.  Suspect related to COPD exacerbation.  No pneumonia on chest x-ray.  Oxygen saturation stable on home oxygen levels.  Initial plan for outpatient management.  However patient had  recurrence of her nosebleed.  As well as recurrence of her difficulty breathing.  Attempted to pack, using gauze soaked with TXA.  This did not stop the bleeding.  Then attempted to use silver nitrate to cauterize area of bleeding.  Also unsuccessful.  Held direct pressure and was able to stop bleeding again.  Patient with recurrence of wheezing and difficulty breathing.  Gave additional albuterol treatments.  Given need for frequent albuterol treatments for COPD exacerbation and recurrence of her nosebleed, will admit for further observation and management.  Discussed case with ENT, recommend packing if needed. Will be available tonight if needed should bleeding recur. I was hesitant initially to place packing given need for supplemental nasal cannula and her COPD exacerbation.  Admitted to [facility] for further management.     Final Clinical Impressions(s) / ED Diagnoses   Final diagnoses:  COPD exacerbation [facility])  Nosebleed    ED Discharge Orders    None       Lucrezia Starch, MD 02/13/19 1527    Lucrezia Starch, MD 02/14/19 [phone]

## 2019-02-13 NOTE — Progress Notes (Signed)
  Medicare.gov - the Conservation officer, historic buildings for [place] health agencies that serve [phone]. [place] Quality of Patient Care Rating Patient Survey Summary Rating [facility] [phone] 3 out of 5 stars 4 out of [place] [phone] 4 out of 5 stars 4 out of [place] [phone] 4  out of 5 stars 3 out of [place] [phone] 4  out of 5 stars 4 out of [place] [phone] 4 out of 5 stars 4 out of [place] [phone] 4 out of 5 stars 4 out of 5 stars [facility] [place] [phone]  out of 5 stars 4 out of [place] [phone] 3 out of 5 stars 4 out of 5 stars [facility] [phone] 4 out of 5 stars Not Available12 [facility] [phone] 3  out of 5 stars 3 out of [place] [phone] 4 out of 5 stars 4 out of [place] [phone] 3  out of 5 stars 4 out of [place] [phone] 3  out of 5 stars 3 out of [place] [phone] 4  out of 5 stars 3 out of [place] [phone] 4  out of 5 stars 2 out of [place] number Footnote as displayed on [place] 1 This agency provides services under a federal waiver program to non-traditional, chronic long term population. 2 This agency provides services to a special needs population. 3 Not Available. 4 The number of patient episodes for this measure is too small to report. 5 This measure currently does not have data or provider has been certified/recertified for less than 6 months. 6 The national average for this  measure is not provided because of state-to-state differences in data collection. 7 Medicare is not displaying rates for this measure for any home health agency, because of an issue with the data. 8 There were problems with the data and they are being corrected. 9 Zero, or very few, patients met the survey's rules for inclusion. The scores shown, if any, reflect a very small number of surveys and may not accurately tell how an agency is doing. 10 Survey results are based on less than 12 months of data. 11 Fewer than 70 patients completed the survey. Use the scores shown, if any, with caution as the number of surveys may be too low to accurately tell how an agency is doing. 12 No survey results are available for this period. 13 Data suppressed by CMS for one or more quarters.

## 2019-02-13 NOTE — ED Triage Notes (Signed)
Patient arrived by EMS from home. PT c/o SOB and nose bleed.   Pt has been having nosebleeds intermittently since starting blood thinners a month ago.   EMS reported clear lung sounds and diminished lower lobe sounds.   Pt was placed on non-rebreather.  Pt was at 87% SpO2 on 3L [place]. Patient is always on 3L [place] at home.   Hx of COPD.

## 2019-02-14 ENCOUNTER — Inpatient Hospital Stay (HOSPITAL_COMMUNITY): Payer: Medicare Other

## 2019-02-14 DIAGNOSIS — F329 Major depressive disorder, single episode, unspecified: Secondary | ICD-10-CM | POA: Diagnosis present

## 2019-02-14 DIAGNOSIS — R509 Fever, unspecified: Secondary | ICD-10-CM

## 2019-02-14 DIAGNOSIS — G4733 Obstructive sleep apnea (adult) (pediatric): Secondary | ICD-10-CM | POA: Diagnosis present

## 2019-02-14 DIAGNOSIS — I2510 Atherosclerotic heart disease of native coronary artery without angina pectoris: Secondary | ICD-10-CM

## 2019-02-14 DIAGNOSIS — J441 Chronic obstructive pulmonary disease with (acute) exacerbation: Principal | ICD-10-CM | POA: Diagnosis present

## 2019-02-14 DIAGNOSIS — J9610 Chronic respiratory failure, unspecified whether with hypoxia or hypercapnia: Secondary | ICD-10-CM | POA: Diagnosis present

## 2019-02-14 DIAGNOSIS — I252 Old myocardial infarction: Secondary | ICD-10-CM | POA: Diagnosis not present

## 2019-02-14 DIAGNOSIS — I10 Essential (primary) hypertension: Secondary | ICD-10-CM | POA: Diagnosis present

## 2019-02-14 DIAGNOSIS — Z955 Presence of coronary angioplasty implant and graft: Secondary | ICD-10-CM | POA: Diagnosis not present

## 2019-02-14 DIAGNOSIS — K219 Gastro-esophageal reflux disease without esophagitis: Secondary | ICD-10-CM | POA: Diagnosis present

## 2019-02-14 DIAGNOSIS — E876 Hypokalemia: Secondary | ICD-10-CM | POA: Diagnosis present

## 2019-02-14 DIAGNOSIS — Z7951 Long term (current) use of inhaled steroids: Secondary | ICD-10-CM | POA: Diagnosis not present

## 2019-02-14 DIAGNOSIS — G4739 Other sleep apnea: Secondary | ICD-10-CM

## 2019-02-14 DIAGNOSIS — Z7982 Long term (current) use of aspirin: Secondary | ICD-10-CM | POA: Diagnosis not present

## 2019-02-14 DIAGNOSIS — Z79899 Other long term (current) drug therapy: Secondary | ICD-10-CM | POA: Diagnosis not present

## 2019-02-14 DIAGNOSIS — Z9981 Dependence on supplemental oxygen: Secondary | ICD-10-CM | POA: Diagnosis not present

## 2019-02-14 DIAGNOSIS — R040 Epistaxis: Secondary | ICD-10-CM

## 2019-02-14 DIAGNOSIS — Z87891 Personal history of nicotine dependence: Secondary | ICD-10-CM | POA: Diagnosis not present

## 2019-02-14 DIAGNOSIS — D500 Iron deficiency anemia secondary to blood loss (chronic): Secondary | ICD-10-CM | POA: Diagnosis not present

## 2019-02-14 DIAGNOSIS — I444 Left anterior fascicular block: Secondary | ICD-10-CM | POA: Diagnosis present

## 2019-02-14 DIAGNOSIS — F419 Anxiety disorder, unspecified: Secondary | ICD-10-CM | POA: Diagnosis present

## 2019-02-14 DIAGNOSIS — R911 Solitary pulmonary nodule: Secondary | ICD-10-CM | POA: Diagnosis present

## 2019-02-14 DIAGNOSIS — D509 Iron deficiency anemia, unspecified: Secondary | ICD-10-CM

## 2019-02-14 DIAGNOSIS — E785 Hyperlipidemia, unspecified: Secondary | ICD-10-CM | POA: Diagnosis present

## 2019-02-14 DIAGNOSIS — Z20828 Contact with and (suspected) exposure to other viral communicable diseases: Secondary | ICD-10-CM | POA: Diagnosis present

## 2019-02-14 DIAGNOSIS — D539 Nutritional anemia, unspecified: Secondary | ICD-10-CM | POA: Diagnosis present

## 2019-02-14 LAB — IRON AND TIBC
Iron: 27 ug/dL — ABNORMAL LOW (ref 28–170)
Saturation Ratios: 10 % — ABNORMAL LOW (ref 10.4–31.8)
TIBC: 277 ug/dL (ref 250–450)
UIBC: 250 ug/dL

## 2019-02-14 LAB — COMPREHENSIVE METABOLIC PANEL
ALT: 20 U/L (ref 0–44)
AST: 18 U/L (ref 15–41)
Albumin: 3.1 g/dL — ABNORMAL LOW (ref 3.5–5.0)
Alkaline Phosphatase: 66 U/L (ref 38–126)
Anion gap: 10 (ref 5–15)
BUN: 20 mg/dL (ref 8–23)
CO2: 35 mmol/L — ABNORMAL HIGH (ref 22–32)
Calcium: 8.6 mg/dL — ABNORMAL LOW (ref 8.9–10.3)
Chloride: 94 mmol/L — ABNORMAL LOW (ref 98–111)
Creatinine, Ser: 0.53 mg/dL (ref 0.44–1.00)
GFR calc Af Amer: >60 mL/min (ref >60)
GFR calc non Af Amer: >60 mL/min (ref >60)
Glucose, Bld: 172 mg/dL — ABNORMAL HIGH (ref 70–99)
Potassium: 3.6 mmol/L (ref 3.5–5.1)
Sodium: 139 mmol/L (ref 135–145)
Total Bilirubin: 0.5 mg/dL (ref 0.3–1.2)
Total Protein: 6.2 g/dL — ABNORMAL LOW (ref 6.5–8.1)

## 2019-02-14 LAB — RETICULOCYTES
Immature Retic Fract: 27.1 % — ABNORMAL HIGH (ref 2.3–15.9)
RBC.: 2.6 MIL/uL — ABNORMAL LOW (ref 3.87–5.11)
Retic Count, Absolute: 61.9 10*3/uL (ref 19.0–186.0)
Retic Ct Pct: 2.4 % (ref 0.4–3.1)

## 2019-02-14 LAB — SARS CORONAVIRUS 2 (TAT 6-24 HRS): SARS Coronavirus 2: NEGATIVE

## 2019-02-14 LAB — CBC WITH DIFFERENTIAL/PLATELET
Abs Immature Granulocytes: 0.04 10*3/uL (ref 0.00–0.07)
Basophils Absolute: 0 10*3/uL (ref 0.0–0.1)
Basophils Relative: 0 %
Eosinophils Absolute: 0 10*3/uL (ref 0.0–0.5)
Eosinophils Relative: 0 %
HCT: 26.2 % — ABNORMAL LOW (ref 36.0–46.0)
Hemoglobin: 7.4 g/dL — ABNORMAL LOW (ref 12.0–15.0)
Immature Granulocytes: 1 %
Lymphocytes Relative: 3 %
Lymphs Abs: 0.2 10*3/uL — ABNORMAL LOW (ref 0.7–4.0)
MCH: 28.2 pg (ref 26.0–34.0)
MCHC: 28.2 g/dL — ABNORMAL LOW (ref 30.0–36.0)
MCV: 100 fL (ref 80.0–100.0)
Monocytes Absolute: 0.4 10*3/uL (ref 0.1–1.0)
Monocytes Relative: 6 %
Neutro Abs: 6.3 10*3/uL (ref 1.7–7.7)
Neutrophils Relative %: 90 %
Platelets: 300 10*3/uL (ref 150–400)
RBC: 2.62 MIL/uL — ABNORMAL LOW (ref 3.87–5.11)
RDW: 16.1 % — ABNORMAL HIGH (ref 11.5–15.5)
WBC: 7 10*3/uL (ref 4.0–10.5)
nRBC: 0 % (ref 0.0–0.2)

## 2019-02-14 LAB — MAGNESIUM: Magnesium: 1.8 mg/dL (ref 1.7–2.4)

## 2019-02-14 LAB — FOLATE: Folate: 7.8 ng/mL (ref >5.9)

## 2019-02-14 LAB — VITAMIN B12
Vitamin B-12: 238 pg/mL (ref 180–914)
Vitamin B-12: 229 pg/mL (ref 180–914)

## 2019-02-14 LAB — HEMOGLOBIN AND HEMATOCRIT, BLOOD
HCT: 32.6 % — ABNORMAL LOW (ref 36.0–46.0)
Hemoglobin: 9.7 g/dL — ABNORMAL LOW (ref 12.0–15.0)

## 2019-02-14 LAB — FERRITIN: Ferritin: 34 ng/mL (ref 11–307)

## 2019-02-14 LAB — HIV ANTIBODY (ROUTINE TESTING W REFLEX): HIV Screen 4th Generation wRfx: NONREACTIVE

## 2019-02-14 LAB — PREPARE RBC (CROSSMATCH)

## 2019-02-14 LAB — ABO/RH: ABO/RH(D): O POS

## 2019-02-14 MED ORDER — AMOXICILLIN-POT CLAVULANATE 875-125 MG PO TABS
1.0000 | ORAL_TABLET | Freq: Two times a day (BID) | ORAL | Status: DC
  Administered 2019-02-14 – 2019-02-15 (×2): 1 via ORAL
  Filled 2019-02-14 (×3): qty 1

## 2019-02-14 MED ORDER — ATORVASTATIN CALCIUM 40 MG PO TABS
80.0000 mg | ORAL_TABLET | Freq: Every day | ORAL | Status: DC
  Administered 2019-02-14: 80 mg via ORAL
  Filled 2019-02-14: qty 2

## 2019-02-14 MED ORDER — MOMETASONE FURO-FORMOTEROL FUM 200-5 MCG/ACT IN AERO
2.0000 | INHALATION_SPRAY | Freq: Two times a day (BID) | RESPIRATORY_TRACT | Status: DC
  Administered 2019-02-14 – 2019-02-15 (×3): 2 via RESPIRATORY_TRACT
  Filled 2019-02-14: qty 8.8

## 2019-02-14 MED ORDER — METHYLPREDNISOLONE SODIUM SUCC 125 MG IJ SOLR
60.0000 mg | Freq: Two times a day (BID) | INTRAMUSCULAR | Status: DC
  Administered 2019-02-14 – 2019-02-15 (×3): 60 mg via INTRAVENOUS
  Filled 2019-02-14 (×3): qty 2

## 2019-02-14 MED ORDER — METOPROLOL SUCCINATE ER 25 MG PO TB24
25.0000 mg | ORAL_TABLET | Freq: Two times a day (BID) | ORAL | Status: DC
  Administered 2019-02-14 – 2019-02-15 (×4): 25 mg via ORAL
  Filled 2019-02-14 (×4): qty 1

## 2019-02-14 MED ORDER — FERROUS SULFATE 325 (65 FE) MG PO TABS
325.0000 mg | ORAL_TABLET | Freq: Two times a day (BID) | ORAL | Status: DC
  Administered 2019-02-14 – 2019-02-15 (×4): 325 mg via ORAL
  Filled 2019-02-14 (×4): qty 1

## 2019-02-14 MED ORDER — IPRATROPIUM-ALBUTEROL 0.5-2.5 (3) MG/3ML IN SOLN
3.0000 mL | Freq: Four times a day (QID) | RESPIRATORY_TRACT | Status: DC
  Administered 2019-02-14: 3 mL via RESPIRATORY_TRACT
  Filled 2019-02-14: qty 3

## 2019-02-14 MED ORDER — PANTOPRAZOLE SODIUM 40 MG PO TBEC
40.0000 mg | DELAYED_RELEASE_TABLET | Freq: Every day | ORAL | Status: DC
  Administered 2019-02-14 – 2019-02-15 (×2): 40 mg via ORAL
  Filled 2019-02-14 (×2): qty 1

## 2019-02-14 MED ORDER — GUAIFENESIN ER 600 MG PO TB12
600.0000 mg | ORAL_TABLET | Freq: Two times a day (BID) | ORAL | Status: DC
  Administered 2019-02-14 – 2019-02-15 (×3): 600 mg via ORAL
  Filled 2019-02-14 (×3): qty 1

## 2019-02-14 MED ORDER — SODIUM CHLORIDE 0.9% IV SOLUTION
Freq: Once | INTRAVENOUS | Status: AC
  Administered 2019-02-14: 10:00:00 via INTRAVENOUS

## 2019-02-14 MED ORDER — ASPIRIN EC 81 MG PO TBEC
81.0000 mg | DELAYED_RELEASE_TABLET | Freq: Every day | ORAL | Status: DC
  Administered 2019-02-14 – 2019-02-15 (×2): 81 mg via ORAL
  Filled 2019-02-14 (×2): qty 1

## 2019-02-14 MED ORDER — ALBUTEROL SULFATE (2.5 MG/3ML) 0.083% IN NEBU: 2.5000 mg | INHALATION_SOLUTION | RESPIRATORY_TRACT | Status: DC | PRN

## 2019-02-14 MED ORDER — ESCITALOPRAM OXALATE 20 MG PO TABS
20.0000 mg | ORAL_TABLET | Freq: Every day | ORAL | Status: DC
  Administered 2019-02-14 – 2019-02-15 (×2): 20 mg via ORAL
  Filled 2019-02-14 (×2): qty 1

## 2019-02-14 MED ORDER — TICAGRELOR 90 MG PO TABS
90.0000 mg | ORAL_TABLET | Freq: Two times a day (BID) | ORAL | Status: DC
  Administered 2019-02-14 – 2019-02-15 (×4): 90 mg via ORAL
  Filled 2019-02-14 (×5): qty 1

## 2019-02-14 MED ORDER — IPRATROPIUM-ALBUTEROL 0.5-2.5 (3) MG/3ML IN SOLN
3.0000 mL | Freq: Three times a day (TID) | RESPIRATORY_TRACT | Status: DC
  Administered 2019-02-14 – 2019-02-15 (×4): 3 mL via RESPIRATORY_TRACT
  Filled 2019-02-14 (×5): qty 3

## 2019-02-14 NOTE — ED Notes (Signed)
ED TO INPATIENT HANDOFF REPORT  ED Nurse Name and Phone #: Shirl HarrisJennifer Mariaha Ellington [phone]   S Name/Age/Gender Debra BruinsBrenda Mcgee 68 y.o. female Room/Bed: WA06/WA06  Code Status   Code Status: Full Code  Home/SNF/Other Home Patient oriented to: self, place, time and situation Is this baseline? Yes   Triage Complete: Triage complete  Chief Complaint shob  Triage Note Patient arrived by EMS from home. PT c/o SOB and nose bleed.   Pt has been having nosebleeds intermittently since starting blood thinners a month ago.   EMS reported clear lung sounds and diminished lower lobe sounds.   Pt was placed on non-rebreather.  Pt was at 87% SpO2 on 3L [place]. Patient is always on 3L  at home.   Hx of COPD.    Allergies No Known Allergies  Level of Care/Admitting Diagnosis ED Disposition    ED Disposition Condition Comment   Admit  [facility] [[id number]]  Level of Care: Telemetry [5]  Admit to tele based on following criteria: Other see comments  Comments: Monitor for bleeding  Covid Evaluation: Confirmed COVID Negative  Diagnosis: COPD with acute exacerbation [facility](HCC) [[id number]][[id number]]  Admitting Physician: [name], [name] [[id number]]  Attending Physician: [name], [name] [[id number]]  PT Class (Do Not Modify): Observation [104]  PT Acc Code (Do Not Modify): Observation [10022]       B Medical/Surgery History Past Medical History:  Diagnosis Date  . Anemia   . Anxiety   . COPD (chronic obstructive pulmonary disease) (HCC)    Stage 4  . Depression   . Heart attack (HCC)   . High blood pressure    History reviewed. No pertinent surgical history.   A IV Location/Drains/Wounds Patient Lines/Drains/Airways Status   Active Line/Drains/Airways    Name:   Placement date:   Placement time:   Site:   Days:   Peripheral IV 02/13/19 Right Hand   02/13/19    1141    Hand   1          Intake/Output Last 24 hours No intake or output data in the 24 hours  ending 02/14/19 0223  Labs/Imaging Results for orders placed or performed during the hospital encounter of 02/13/19 (from the past 48 hour(s))  CBC with Differential     Status: Abnormal   Collection Time: 02/13/19 12:09 PM  Result Value Ref Range   WBC 7.9 4.0 - 10.5 K/uL   RBC 3.11 (L) 3.87 - 5.11 MIL/uL   Hemoglobin 8.9 (L) 12.0 - 15.0 g/dL   HCT 84.631.4 (L) 96.236.0 - 95.246.0 %   MCV 101.0 (H) 80.0 - 100.0 fL   MCH 28.6 26.0 - 34.0 pg   MCHC 28.3 (L) 30.0 - 36.0 g/dL   RDW 84.116.3 (H) 32.411.5 - 40.115.5 %   Platelets 290 150 - 400 K/uL   nRBC 0.0 0.0 - 0.2 %   Neutrophils Relative % 86 %   Neutro Abs 6.8 1.7 - 7.7 K/uL   Lymphocytes Relative 4 %   Lymphs Abs 0.3 (L) 0.7 - 4.0 K/uL   Monocytes Relative 9 %   Monocytes Absolute 0.7 0.1 - 1.0 K/uL   Eosinophils Relative 0 %   Eosinophils Absolute 0.0 0.0 - 0.5 K/uL   Basophils Relative 0 %   Basophils Absolute 0.0 0.0 - 0.1 K/uL   Immature Granulocytes 1 %   Abs Immature Granulocytes 0.05 0.00 - 0.07 K/uL    Comment: Performed at [facility], [address] [place],  [place] [address]  Basic metabolic panel     Status: Abnormal   Collection Time: 02/13/19 12:09 PM  Result Value Ref Range   Sodium 142 135 - 145 mmol/L   Potassium 3.2 (L) 3.5 - 5.1 mmol/L   Chloride 94 (L) 98 - 111 mmol/L   CO2 38 (H) 22 - 32 mmol/L   Glucose, Bld 146 (H) 70 - 99 mg/dL   BUN 13 8 - 23 mg/dL   Creatinine, Ser 6.040.65 0.44 - 1.00 mg/dL   Calcium 9.0 8.9 - 54.010.3 mg/dL   GFR calc non Af Amer >60 >60 mL/min   GFR calc Af Amer >60 >60 mL/min   Anion gap 10 5 - 15    Comment: Performed at [facility], [address] [place], [place] [address]  Troponin I (High Sensitivity)     Status: None   Collection Time: 02/13/19 12:09 PM  Result Value Ref Range   Troponin I (High Sensitivity) 12 <18 ng/L    Comment: (NOTE) Elevated high sensitivity troponin I (hsTnI) values and significant  changes across serial measurements may  suggest ACS but many other  chronic and acute conditions are known to elevate hsTnI results.  Refer to the "Links" section for chest pain algorithms and additional  guidance. Performed at [facility], [address] [place], [place] [address]   Brain natriuretic peptide     Status: None   Collection Time: 02/13/19 12:09 PM  Result Value Ref Range   B Natriuretic Peptide 50.8 0.0 - 100.0 pg/mL    Comment: Performed at [facility], [address] [place], [place] [address]  Protime-INR     Status: None   Collection Time: 02/13/19 12:09 PM  Result Value Ref Range   Prothrombin Time 11.8 11.4 - 15.2 seconds   INR 0.9 0.8 - 1.2    Comment: (NOTE) INR goal varies based on device and disease states. Performed at [facility], [address] [place], [place] [address]   SARS CORONAVIRUS 2     Status: None   Collection Time: 02/13/19  3:18 PM  Result Value Ref Range   SARS Coronavirus 2 NEGATIVE NEGATIVE    Comment: (NOTE) SARS-CoV-2 target nucleic acids are NOT DETECTED. The SARS-CoV-2 RNA is generally detectable in upper and lower respiratory specimens during the acute phase of infection. Negative results do not preclude SARS-CoV-2 infection, do not rule out co-infections with other pathogens, and should not be used as the sole basis for treatment or other patient management decisions. Negative results must be combined with clinical observations, patient history, and epidemiological information. The expected result is Negative. Fact Sheet for Patients: HairSlick.nohttps://www.fda.gov/media/138098/download Fact Sheet for Healthcare Providers: quierodirigir.comhttps://www.fda.gov/media/138095/download This test is not yet approved or cleared by the [place] FDA and  has been authorized for detection and/or diagnosis of SARS-CoV-2 by FDA under an Emergency Use Authorization (EUA). This EUA will remain  in effect (meaning this test can be used)  for the duration of the COVID-19 declaration under Section 56 4(b)(1) of the Act, 21 U.S.C. section 360bbb-3(b)(1), unless the authorization is terminated or revoked sooner. Performed at [facility], [address] [place], [place] [address]   SARS Coronavirus 2 [facility](Hospital order, Performed in [facility] lab) Nasopharyngeal Nasopharyngeal Swab     Status: None   Collection Time: 02/13/19  7:54 PM   Specimen: Nasopharyngeal Swab  Result Value Ref Range   SARS Coronavirus 2 NEGATIVE NEGATIVE  Comment: (NOTE) If result is NEGATIVE SARS-CoV-2 target nucleic acids are NOT DETECTED. The SARS-CoV-2 RNA is generally detectable in upper and lower  respiratory specimens during the acute phase of infection. The lowest  concentration of SARS-CoV-2 viral copies this assay can detect is 250  copies / mL. A negative result does not preclude SARS-CoV-2 infection  and should not be used as the sole basis for treatment or other  patient management decisions.  A negative result may occur with  improper specimen collection / handling, submission of specimen other  than nasopharyngeal swab, presence of viral mutation(s) within the  areas targeted by this assay, and inadequate number of viral copies  (<250 copies / mL). A negative result must be combined with clinical  observations, patient history, and epidemiological information. If result is POSITIVE SARS-CoV-2 target nucleic acids are DETECTED. The SARS-CoV-2 RNA is generally detectable in upper and lower  respiratory specimens dur ing the acute phase of infection.  Positive  results are indicative of active infection with SARS-CoV-2.  Clinical  correlation with patient history and other diagnostic information is  necessary to determine patient infection status.  Positive results do  not rule out bacterial infection or co-infection with other viruses. If result is PRESUMPTIVE POSTIVE SARS-CoV-2 nucleic acids MAY BE PRESENT.   A  presumptive positive result was obtained on the submitted specimen  and confirmed on repeat testing.  While 2019 novel coronavirus  (SARS-CoV-2) nucleic acids may be present in the submitted sample  additional confirmatory testing may be necessary for epidemiological  and / or clinical management purposes  to differentiate between  SARS-CoV-2 and other Sarbecovirus currently known to infect humans.  If clinically indicated additional testing with an alternate test  methodology [id number]) is advised. The SARS-CoV-2 RNA is generally  detectable in upper and lower respiratory sp ecimens during the acute  phase of infection. The expected result is Negative. Fact Sheet for Patients:  BoilerBrush.com.cyhttps://www.fda.gov/media/136312/download Fact Sheet for Healthcare Providers: https://pope.com/https://www.fda.gov/media/136313/download This test is not yet approved or cleared by the [place] FDA and has been authorized for detection and/or diagnosis of SARS-CoV-2 by FDA under an Emergency Use Authorization (EUA).  This EUA will remain in effect (meaning this test can be used) for the duration of the COVID-19 declaration under Section 564(b)(1) of the Act, 21 U.S.C. section 360bbb-3(b)(1), unless the authorization is terminated or revoked sooner. Performed at [facility], [address] [place], [place] [address]    Dg Chest Portable 1 View  Result Date: 02/13/2019 CLINICAL DATA:  Shortness of breath and fever EXAM: PORTABLE CHEST 1 VIEW COMPARISON:  None. FINDINGS: There is mild cardiomegaly. There is atherosclerotic calcification the aortic knob with a tortuous descending aorta. 8 mm nodular opacity seen overlying the lateral aspect of the lower left lung. The right lung is clear. IMPRESSION: 1. 8 mm nodular opacity seen overlying the left mid lung. If further evaluation is required would recommend CT chest with contrast. 2. Mild cardiomegaly Electronically Signed   By: Jonna ClarkBindu  Avutu M.D.   On:  02/13/2019 12:33    Pending Labs Unresulted Labs (From admission, onward)    Start     Ordered   02/14/19 0500  Vitamin B12  Tomorrow morning,   R     02/13/19 2123   02/14/19 0500  Folate RBC  Tomorrow morning,   R     02/13/19 2123   02/14/19 0500  CBC with Differential/Platelet  Tomorrow morning,   R  02/13/19 2123   02/14/19 0500  Comprehensive metabolic panel  Tomorrow morning,   R     02/13/19 2123   02/14/19 0500  Magnesium  Tomorrow morning,   R     02/13/19 2123   02/13/19 2022  HIV antibody (Routine Testing)  Once,   STAT     02/13/19 2030          Vitals/Pain Today's Vitals   02/14/19 0100 02/14/19 0130 02/14/19 0158 02/14/19 0200  BP: (!) 156/79 116/61  (!) 128/58  Pulse: 84 67  70  Resp:      Temp:      TempSrc:      SpO2: 98% 100%  97%  PainSc:   Asleep     Isolation Precautions No active isolations  Medications Medications  albuterol (VENTOLIN HFA) 108 (90 Base) MCG/ACT inhaler 2 puff (2 puffs Inhalation Given 02/13/19 2215)  dextromethorphan-guaiFENesin (MUCINEX DM) 30-600 MG per 12 hr tablet 1 tablet (0 tablets Oral Hold 02/13/19 2200)  methylPREDNISolone sodium succinate (SOLU-MEDROL) 40 mg/mL injection 40 mg (has no administration in time range)  albuterol (VENTOLIN HFA) 108 (90 Base) MCG/ACT inhaler 2 puff (2 puffs Inhalation Given 02/13/19 1238)  methylPREDNISolone sodium succinate (SOLU-MEDROL) 125 mg/2 mL injection 125 mg (125 mg Intravenous Given 02/13/19 1238)  ipratropium (ATROVENT HFA) inhaler 2 puff (2 puffs Inhalation Given 02/13/19 1238)  oxymetazoline (AFRIN) 0.05 % nasal spray 1 spray (1 spray Each Nare Given 02/13/19 1635)  tranexamic acid (CYKLOKAPRON) injection 500 mg (500 mg Topical Given by Other 02/13/19 1800)  silver nitrate applicators applicator 1 Stick (1 Stick Topical Given 02/13/19 1841)  albuterol (VENTOLIN HFA) 108 (90 Base) MCG/ACT inhaler 2 puff (2 puffs Inhalation Given 02/13/19 1952)  potassium chloride SA (K-DUR) CR tablet 40 mEq  (40 mEq Oral Given 02/13/19 2214)    Mobility walks High fall risk

## 2019-02-14 NOTE — ED Notes (Signed)
Called report to [facility] on 4E

## 2019-02-14 NOTE — Care Management Obs Status (Signed)
[facility] NOTIFICATION   Patient Details  Name: Debra Mcgee MRN: [id number] Date of Birth: [birth year]   Medicare Observation Status Notification Given:  Yes    Joaquin Courts, RN 02/14/2019, 3:07 PM

## 2019-02-14 NOTE — Progress Notes (Signed)
PT Cancellation Note  Patient Details Name: Debra Mcgee MRN: [id number] DOB: 10-Sep-1950   Cancelled Treatment:    Reason Eval/Treat Not Completed: Other (comment) Pt just got her food and wanted to eat after just finishing OT Eval. Will continue efforts to complete PT eval    [facility] 02/14/2019, 4:34 PM

## 2019-02-14 NOTE — Evaluation (Signed)
Occupational Therapy Evaluation Patient Details Name: Debra Mcgee MRN: [id number] DOB: 26-Nov-1950 Today's Date: 02/14/2019    History of Present Illness 68 yo female admitted with SOB and nose bleeds. Pt with hgb 7.4 with blood transfusion 02/14/19 PMH COPD 4L CAD s/p stent july 2020 OSA  HTN   Clinical Impression   PT admitted with SOB and nose bleed. Pt currently with functional limitiations due to the deficits listed below (see OT problem list). Pt currently min guard with RW and 4L oxygen saturation 91%. Pt requires rest breaks after less than 15 feet . Pt fatigues quickly. Pt will need family (A) upon d/c home.  Pt will benefit from skilled OT to increase their independence and safety with adls and balance to allow discharge home. OT to follow for energy conservation.     Follow Up Recommendations  No OT follow up    Equipment Recommendations  None recommended by OT    Recommendations for Other Services       Precautions / Restrictions Precautions Precautions: Fall Precaution Comments: 4L oxygen dependent      Mobility Bed Mobility               General bed mobility comments: sitting EOB with RN staff on arrival   Transfers Overall transfer level: Needs assistance Equipment used: Rolling walker (2 wheeled) Transfers: Sit to/from Stand Sit to Stand: Supervision         General transfer comment: pt with RW slightly too tall for patient - pt could benefit from youth walker    Balance Overall balance assessment: Mild deficits observed, not formally tested                                         ADL either performed or assessed with clinical judgement   ADL Overall ADL's : Needs assistance/impaired Eating/Feeding: Modified independent   Grooming: Wash/dry face   Upper Body Bathing: Minimal assistance   Lower Body Bathing: Moderate assistance   Upper Body Dressing : Minimal assistance   Lower Body Dressing: Moderate assistance   Toilet Transfer: Minimal assistance           Functional mobility during ADLs: Min guard;Rolling walker General ADL Comments: pt demonstrates transfer that would equal bedroom to kitchen transfer. pt requires x2 rest break with forearms leaning on RW due to SOB     Vision         Perception     Praxis      Pertinent Vitals/Pain Pain Assessment: No/denies pain     Hand Dominance Right   Extremity/Trunk Assessment Upper Extremity Assessment Upper Extremity Assessment: Generalized weakness   Lower Extremity Assessment Lower Extremity Assessment: Defer to PT evaluation   Cervical / Trunk Assessment Cervical / Trunk Assessment: Kyphotic   Communication Communication Communication: HOH   Cognition Arousal/Alertness: Awake/alert Behavior During Therapy: WFL for tasks assessed/performed Overall Cognitive Status: Within Functional Limits for tasks assessed                                     General Comments  4L o2 with 91% saturations    Exercises     Shoulder Instructions      Home Living Family/patient expects to be discharged to:: Private residence Living Arrangements: Children  Bathroom Shower/Tub: Chief Strategy OfficerTub/shower unit   Bathroom Toilet: Standard     Home Equipment: Environmental consultantWalker - 2 wheels          Prior Functioning/Environment Level of Independence: Independent                 OT Problem List: Decreased strength;Decreased activity tolerance;Impaired balance (sitting and/or standing);Decreased safety awareness;Decreased knowledge of use of DME or AE;Decreased knowledge of precautions      OT Treatment/Interventions: Self-care/ADL training;Therapeutic exercise;DME and/or AE instruction;Energy conservation;Therapeutic activities;Patient/family education;Balance training    OT Goals(Current goals can be found in the care plan section) Acute Rehab OT Goals OT Goal Formulation: With patient Time For Goal Achievement:  02/28/19 Potential to Achieve Goals: Good  OT Frequency: Min 2X/week   Barriers to D/C:            Co-evaluation              AM-PAC OT "6 Clicks" Daily Activity     Outcome Measure Help from another person eating meals?: None Help from another person taking care of personal grooming?: A Little Help from another person toileting, which includes using toliet, bedpan, or urinal?: A Little Help from another person bathing (including washing, rinsing, drying)?: A Little Help from another person to put on and taking off regular upper body clothing?: A Little Help from another person to put on and taking off regular lower body clothing?: A Little 6 Click Score: 19   End of Session Equipment Utilized During Treatment: Gait belt;Rolling walker Nurse Communication: Mobility status;Precautions  Activity Tolerance: Patient tolerated treatment well Patient left: in chair;with call bell/phone within reach;with nursing/sitter in room  OT Visit Diagnosis: Unsteadiness on feet (R26.81);Muscle weakness (generalized) (M62.81)                Time: 1450-1511 OT Time Calculation (min): 21 min Charges:  OT General Charges $OT Visit: 1 Visit OT Evaluation $OT Eval Moderate Complexity: 1 Mod   Mateo FlowBrynn Niyah Mamaril, OTR/L  Acute Rehabilitation Services Pager: [phone] Office: [phone] .   Mateo FlowBrynn Elizzie Westergard 02/14/2019, 3:21 PM

## 2019-02-14 NOTE — Progress Notes (Signed)
CPAP not on at this time second to nose bleed.  Will be available if needed at later time.

## 2019-02-14 NOTE — ED Notes (Signed)
Called 4E to give report, was told to call back in 5 minutes

## 2019-02-14 NOTE — Progress Notes (Signed)
Pt's temperature before initiation of blood transfusion at 1157- 97.5. 15 minutes after start of transfusion- 97.8. Post blood transfusion- 99.3. No other signs of a transfusion reaction, and pt had a temp of 99 or greater early this AM and on admission. MD made aware- ordered CXR and PO Augmentin. MD aware a transfusion reaction workup was not done. Reheck temp at 1533 was 98.4. Pt tolerated transfusion well, no complications.

## 2019-02-14 NOTE — Progress Notes (Signed)
PROGRESS NOTE  Debra Mcgee FTD:[id number] DOB: [birth year]   PCP: System, Pcp Not In  Patient is from: Home  DOA: 02/13/2019 LOS: 0  Brief Narrative / Interim history: 68 year old female with history of COPD/chronic respiratory failure on 4 LPM, OSA on CPAP, CAD/stent on 01/20/2019, HTN, depression, anxiety and GERD presenting with shortness of breath and frequent nosebleeds.  She was admitted for COPD exacerbation and epistaxis.     In ED, hemodynamically stable.  Febrile to 99.8 (geriatric).  Saturating in 90s on home oxygen.  No documented desaturation.  Hgb 8.9 (the same as previous day).  K3.2.  Bicarb 38.  BNP and high-sensitivity troponin normal.  EKG sinus rhythm with LAFB but no acute ischemic finding.  SARS-CoV-2 negative.  CXR with 8 mm nodular opacity overlying the left midlung and mild cardiomegaly.  Reportedly ENT, Dr. Janace Hoard was consulted by EDP.  Epistaxis resolved with nasal pressure.  She did not tolerate nasal packing due to shortness of breath.  She was started on IV steroid and breathing treatments for COPD exacerbation.  Subjective: No major events overnight of this morning.  No complaints.  Reports some improvement in her breathing but not quite back to baseline.  Denies chest pain, dizziness or palpitation.  Denies nausea, vomiting, abdominal pain or GU symptoms other than chronic urgency or incontinence.  No dysuria, suprapubic or flank pain.  Hemoglobin dropped to 7.4.  Anemia panel consistent with deficiency.  Agreeable to blood transfusion.  She had recent CAD with stent placement.  Objective: Vitals:   02/14/19 0810 02/14/19 1157 02/14/19 1232 02/14/19 1458  BP:  (!) 154/92 (!) 143/53 (!) 163/66  Pulse:  81 75 84  Resp:  20 (!) 22 20  Temp:  (!) 97.5 F (36.4 C) 97.8 F (36.6 C) 99.3 F (37.4 C)  TempSrc:  Oral Oral Oral  SpO2: 97% 100% 96% 97%  Weight:      Height:        Intake/Output Summary (Last 24 hours) at 02/14/2019 1515 Last data filed at  02/14/2019 1400 Gross per 24 hour  Intake 240 ml  Output 750 ml  Net -510 ml   Filed Weights   02/14/19 0330  Weight: 60.7 kg    Examination:  GENERAL: No acute distress.  Appears well.  HEENT: MMM.  Vision and hearing grossly intact.  NECK: Supple.  No apparent JVD.  RESP:  No IWOB. Good air movement bilaterally. CVS:  RRR. Heart sounds normal.  ABD/GI/GU: Bowel sounds present. Soft. Non tender.  MSK/EXT:  Moves extremities. No apparent deformity or edema.  SKIN: no apparent skin lesion or wound NEURO: Awake, alert and oriented appropriately.  No gross deficit.  PSYCH: Calm. Normal affect.    I have personally reviewed the following labs and images:  Radiology Studies: No results found.  Microbiology: Recent Results (from the past 240 hour(s))  SARS CORONAVIRUS 2     Status: None   Collection Time: 02/13/19  3:18 PM  Result Value Ref Range Status   SARS Coronavirus 2 NEGATIVE NEGATIVE Final    Comment: (NOTE) SARS-CoV-2 target nucleic acids are NOT DETECTED. The SARS-CoV-2 RNA is generally detectable in upper and lower respiratory specimens during the acute phase of infection. Negative results do not preclude SARS-CoV-2 infection, do not rule out co-infections with other pathogens, and should not be used as the sole basis for treatment or other patient management decisions. Negative results must be combined with clinical observations, patient history, and epidemiological information. The expected result  is Negative. Fact Sheet for Patients: HairSlick.nohttps://www.fda.gov/media/138098/download Fact Sheet for Healthcare Providers: quierodirigir.comhttps://www.fda.gov/media/138095/download This test is not yet approved or cleared by the [place] FDA and  has been authorized for detection and/or diagnosis of SARS-CoV-2 by FDA under an Emergency Use Authorization (EUA). This EUA will remain  in effect (meaning this test can be used) for the duration of the COVID-19 declaration under Section  56 4(b)(1) of the Act, 21 U.S.C. section 360bbb-3(b)(1), unless the authorization is terminated or revoked sooner. Performed at [facility], [address] [place], [place] [address]   SARS Coronavirus 2 [facility](Hospital order, Performed in [facility] hospital lab) Nasopharyngeal Nasopharyngeal Swab     Status: None   Collection Time: 02/13/19  7:54 PM   Specimen: Nasopharyngeal Swab  Result Value Ref Range Status   SARS Coronavirus 2 NEGATIVE NEGATIVE Final    Comment: (NOTE) If result is NEGATIVE SARS-CoV-2 target nucleic acids are NOT DETECTED. The SARS-CoV-2 RNA is generally detectable in upper and lower  respiratory specimens during the acute phase of infection. The lowest  concentration of SARS-CoV-2 viral copies this assay can detect is 250  copies / mL. A negative result does not preclude SARS-CoV-2 infection  and should not be used as the sole basis for treatment or other  patient management decisions.  A negative result may occur with  improper specimen collection / handling, submission of specimen other  than nasopharyngeal swab, presence of viral mutation(s) within the  areas targeted by this assay, and inadequate number of viral copies  (<250 copies / mL). A negative result must be combined with clinical  observations, patient history, and epidemiological information. If result is POSITIVE SARS-CoV-2 target nucleic acids are DETECTED. The SARS-CoV-2 RNA is generally detectable in upper and lower  respiratory specimens dur ing the acute phase of infection.  Positive  results are indicative of active infection with SARS-CoV-2.  Clinical  correlation with patient history and other diagnostic information is  necessary to determine patient infection status.  Positive results do  not rule out bacterial infection or co-infection with other viruses. If result is PRESUMPTIVE POSTIVE SARS-CoV-2 nucleic acids MAY BE PRESENT.   A presumptive positive result was obtained on the  submitted specimen  and confirmed on repeat testing.  While 2019 novel coronavirus  (SARS-CoV-2) nucleic acids may be present in the submitted sample  additional confirmatory testing may be necessary for epidemiological  and / or clinical management purposes  to differentiate between  SARS-CoV-2 and other Sarbecovirus currently known to infect humans.  If clinically indicated additional testing with an alternate test  methodology [id number]) is advised. The SARS-CoV-2 RNA is generally  detectable in upper and lower respiratory sp ecimens during the acute  phase of infection. The expected result is Negative. Fact Sheet for Patients:  BoilerBrush.com.cyhttps://www.fda.gov/media/136312/download Fact Sheet for Healthcare Providers: https://pope.com/https://www.fda.gov/media/136313/download This test is not yet approved or cleared by the [place] FDA and has been authorized for detection and/or diagnosis of SARS-CoV-2 by FDA under an Emergency Use Authorization (EUA).  This EUA will remain in effect (meaning this test can be used) for the duration of the COVID-19 declaration under Section 564(b)(1) of the Act, 21 U.S.C. section 360bbb-3(b)(1), unless the authorization is terminated or revoked sooner. Performed at [facility], [address] [place], [place] [address]     Sepsis Labs: Invalid input(s): PROCALCITONIN, LACTICIDVEN  Urine analysis: No results found for: COLORURINE, APPEARANCEUR, LABSPEC, PHURINE, GLUCOSEU, HGBUR, BILIRUBINUR, KETONESUR, PROTEINUR, UROBILINOGEN, NITRITE, LEUKOCYTESUR  Anemia Panel: Recent Labs    02/14/19 0452 02/14/19 0752 02/14/19 0808  VITAMINB12 238 229  --   FOLATE  --   --  7.8  FERRITIN  --  34  --   TIBC  --  277  --   IRON  --  27*  --   RETICCTPCT  --  2.4  --     Thyroid Function Tests: Recent Labs    02/12/19 1457  TSH 0.94    Lipid Profile: No results for input(s): CHOL, HDL, LDLCALC, TRIG, CHOLHDL, LDLDIRECT in the last 72 hours.   CBG: No results for input(s): GLUCAP in the last 168 hours.  HbA1C: No results for input(s): HGBA1C in the last 72 hours.  BNP (last 3 results): No results for input(s): PROBNP in the last 8760 hours.  Cardiac Enzymes: No results for input(s): CKTOTAL, CKMB, CKMBINDEX, TROPONINI in the last 168 hours.  Coagulation Profile: Recent Labs  Lab 02/13/19 1209  INR 0.9    Liver Function Tests: Recent Labs  Lab 02/12/19 1457 02/14/19 0452  AST 17 18  ALT 16 20  ALKPHOS  --  66  BILITOT 0.7 0.5  PROT 6.1 6.2*  ALBUMIN  --  3.1*   No results for input(s): LIPASE, AMYLASE in the last 168 hours. No results for input(s): AMMONIA in the last 168 hours.  Basic Metabolic Panel: Recent Labs  Lab 02/12/19 1457 02/13/19 1209 02/14/19 0452  NA 140 142 139  K 3.7 3.2* 3.6  CL 94* 94* 94*  CO2 37* 38* 35*  GLUCOSE 129* 146* 172*  BUN 15 13 20   CREATININE 0.68 0.65 0.53  CALCIUM 9.2 9.0 8.6*  MG  --   --  1.8   GFR: Estimated Creatinine Clearance: 51.9 mL/min (by C-G formula based on SCr of 0.53 mg/dL).  CBC: Recent Labs  Lab 02/12/19 1457 02/13/19 1209 02/14/19 0452  WBC 7.7 7.9 7.0  NEUTROABS 6,568 6.8 6.3  HGB 8.9* 8.9* 7.4*  HCT 29.2* 31.4* 26.2*  MCV 93.0 101.0* 100.0  PLT 323 290 300    Procedures:  None  Microbiology summarized: SARS-CoV-2 negative.  Assessment & Plan: Shortness of breath due to acute COPD exacerbation: On 4 L continuously at home.  Elevated bicarb on BMP suggestive for CO2 retention likely due to high level of oxygen at home.  Portable x-ray with 8 mm lung nodule over LML. -We will continue IV Solu-Medrol, Dulera and breathing treatments. -Add Augmentin.  She also has mild fever without clear source. -Obtain 2 view x-ray. -Wean oxygen as able to keep oxygen saturation between 88 and 92%. -Mucolytic's, incentive spirometry and flutter valve. -Ambulated daily. -PT/OT eval.  Epistaxis: Likely due to high level oxygen use. -Controlled  with nasal pressure. -Wean oxygen as able  Iron deficiency anemia: Likely due to ongoing epistaxis.  She denies melena or hematochezia.  Hemoglobin dropped to 7.4.  Anemia panel consistent with iron deficiency. -Transfuse 1 unit.  -Hgb goal greater than 8 given CAD. -Will infuse Feraheme after blood transfusion. -Continue oral ferrous sulfate. -Monitor H&H  Recent CAD/stent on 01/20/2019: No anginal symptoms.  EKG sinus rhythm with LAFB but no acute ischemic finding.  High-sensitivity troponin negative.  BNP negative as well. -Continue cardiac meds including Brilinta, aspirin and statin.  Essential hypertension: BP elevated this morning. -Continue home cardiac meds.  Hypokalemia: Resolved. -Recheck in the morning  Depression/anxiety: -Resume home Lexapro.  Lung nodule: -Repeat two-view x-ray -May need outpatient surveillance CT.  DVT prophylaxis:  SCD in the setting of epistaxis Code Status: Full code Family Communication: Patient and/or RN. Available if any question.  Disposition Plan: Remains inpatient.  Still with some dyspnea.  Hemoglobin dropped from 9.5-7.4, and anemia panel with iron deficiency. Needs IV systemic steroid for COPD, and blood transfusion and iron transfusion given history of CAD.  Also fever of unknown source that needs evaluation.  Patient has significant comorbidities including recent CAD.  She is very high risk for deconditioning. Consultants: None   Antimicrobials: Anti-infectives (From admission, onward)   None      Sch Meds:  Scheduled Meds: . aspirin EC  81 mg Oral Daily  . atorvastatin  80 mg Oral QHS  . ferrous sulfate  325 mg Oral BID  . guaiFENesin  600 mg Oral BID  . ipratropium-albuterol  3 mL Nebulization TID  . methylPREDNISolone (SOLU-MEDROL) injection  60 mg Intravenous Q12H  . metoprolol succinate  25 mg Oral BID  . mometasone-formoterol  2 puff Inhalation BID  . pantoprazole  40 mg Oral Daily  . ticagrelor  90 mg Oral BID    Continuous Infusions: PRN Meds:.albuterol  35 minutes with more than 50% spent in reviewing records, counseling patient and coordinating care.  Alrick Cubbage T. Elayah Klooster [facility]  If 7PM-7AM, please contact night-coverage [email or web address] Password [email or web address] 02/14/2019, 3:15 PM

## 2019-02-15 DIAGNOSIS — D500 Iron deficiency anemia secondary to blood loss (chronic): Secondary | ICD-10-CM

## 2019-02-15 LAB — BASIC METABOLIC PANEL
Anion gap: 9 (ref 5–15)
BUN: 24 mg/dL — ABNORMAL HIGH (ref 8–23)
CO2: 35 mmol/L — ABNORMAL HIGH (ref 22–32)
Calcium: 8.9 mg/dL (ref 8.9–10.3)
Chloride: 98 mmol/L (ref 98–111)
Creatinine, Ser: 0.59 mg/dL (ref 0.44–1.00)
GFR calc Af Amer: >60 mL/min (ref >60)
GFR calc non Af Amer: >60 mL/min (ref >60)
Glucose, Bld: 165 mg/dL — ABNORMAL HIGH (ref 70–99)
Potassium: 3.7 mmol/L (ref 3.5–5.1)
Sodium: 142 mmol/L (ref 135–145)

## 2019-02-15 LAB — TYPE AND SCREEN
ABO/RH(D): O POS
Antibody Screen: NEGATIVE
Unit division: 0

## 2019-02-15 LAB — CBC
HCT: 32.5 % — ABNORMAL LOW (ref 36.0–46.0)
Hemoglobin: 9.4 g/dL — ABNORMAL LOW (ref 12.0–15.0)
MCH: 28.5 pg (ref 26.0–34.0)
MCHC: 28.9 g/dL — ABNORMAL LOW (ref 30.0–36.0)
MCV: 98.5 fL (ref 80.0–100.0)
Platelets: 380 10*3/uL (ref 150–400)
RBC: 3.3 MIL/uL — ABNORMAL LOW (ref 3.87–5.11)
RDW: 16.7 % — ABNORMAL HIGH (ref 11.5–15.5)
WBC: 12.2 10*3/uL — ABNORMAL HIGH (ref 4.0–10.5)
nRBC: 0 % (ref 0.0–0.2)

## 2019-02-15 LAB — BPAM RBC
Blood Product Expiration Date: 202009022359
ISSUE DATE / TIME: 202008081203
Unit Type and Rh: 5100

## 2019-02-15 LAB — MAGNESIUM: Magnesium: 1.9 mg/dL (ref 1.7–2.4)

## 2019-02-15 MED ORDER — PREDNISONE 50 MG PO TABS: 50.0000 mg | ORAL_TABLET | Freq: Every day | ORAL | 0 refills | Status: DC

## 2019-02-15 MED ORDER — AMOXICILLIN-POT CLAVULANATE 875-125 MG PO TABS: 1.0000 | ORAL_TABLET | Freq: Two times a day (BID) | ORAL | 0 refills | Status: AC

## 2019-02-15 MED ORDER — BUSPIRONE HCL 5 MG PO TABS: 5.0000 mg | ORAL_TABLET | Freq: Three times a day (TID) | ORAL | 1 refills | Status: AC

## 2019-02-15 MED ORDER — BUSPIRONE HCL 5 MG PO TABS
5.0000 mg | ORAL_TABLET | Freq: Three times a day (TID) | ORAL | Status: DC
  Administered 2019-02-15: 5 mg via ORAL
  Filled 2019-02-15: qty 1

## 2019-02-15 NOTE — Evaluation (Signed)
Physical Therapy Evaluation Patient Details Name: Debra Mcgee MRN: [id number] DOB: [birth year] Today's Date: 02/15/2019   History of Present Illness  68 yo female admitted with SOB and nose bleeds. Pt with hgb 7.4 with blood transfusion 02/14/19 PMH COPD 4L CAD s/p stent july 2020 OSA  HTN  Clinical Impression  Pt admitted with above diagnosis. Pt currently with functional limitations due to the deficits listed below (see PT Problem List).  See below for O2 details; pt amb ~ 5660' with RW and min/guard assist. Tolerated well.   Pt will benefit from skilled PT to increase their independence and safety with mobility to allow discharge to the venue listed below.       Follow Up Recommendations Home health PT    Equipment Recommendations  None recommended by PT    Recommendations for Other Services       Precautions / Restrictions Precautions Precautions: Fall Precaution Comments: 4L oxygen dependent Restrictions Weight Bearing Restrictions: No      Mobility  Bed Mobility Overal bed mobility: Modified Independent                Transfers Overall transfer level: Needs assistance Equipment used: Rolling walker (2 wheeled) Transfers: Sit to/from Stand Sit to Stand: Supervision         General transfer comment: cues for overall safety and hand placement  Ambulation/Gait Ambulation/Gait assistance: Min guard Gait Distance (Feet): 60 Feet(10' more) Assistive device: Rolling walker (2 wheeled) Gait Pattern/deviations: Step-through pattern     General Gait Details: cues for RW position and posture. SpO2=91-95% on 4L, decr to 87% with O2 at 3L briefly, left at 4L for recovery x3 minutes; pt is tolerating 1L at rest with sats 91-92%  Stairs            Wheelchair Mobility    Modified Rankin (Stroke Patients Only)       Balance Overall balance assessment: Needs assistance   Sitting balance-Leahy Scale: Good       Standing balance-Leahy Scale:  Fair Standing balance comment: reliant on UE support for dynamic tasks                             Pertinent Vitals/Pain Pain Assessment: No/denies pain    Home Living Family/patient expects to be discharged to:: Private residence Living Arrangements: Children Available Help at Discharge: Family           Home Equipment: Dan HumphreysWalker - 2 wheels;Cane - single point      Prior Function Level of Independence: Independent               Hand Dominance        Extremity/Trunk Assessment   Upper Extremity Assessment Upper Extremity Assessment: Defer to OT evaluation;Generalized weakness    Lower Extremity Assessment Lower Extremity Assessment: Generalized weakness       Communication      Cognition Arousal/Alertness: Awake/alert Behavior During Therapy: WFL for tasks assessed/performed Overall Cognitive Status: Within Functional Limits for tasks assessed                                        General Comments      Exercises     Assessment/Plan    PT Assessment Patient needs continued PT services  PT Problem List Decreased strength;Decreased activity tolerance;Decreased balance;Decreased knowledge of use of DME;Decreased mobility  PT Treatment Interventions DME instruction;Gait training;Therapeutic exercise;Functional mobility training;Therapeutic activities;Patient/family education    PT Goals (Current goals can be found in the Care Plan section)  Acute Rehab PT Goals Time For Goal Achievement: 03/01/19 Potential to Achieve Goals: Good    Frequency Min 3X/week   Barriers to discharge        Co-evaluation               AM-PAC PT "6 Clicks" Mobility  Outcome Measure Help needed turning from your back to your side while in a flat bed without using bedrails?: None Help needed moving from lying on your back to sitting on the side of a flat bed without using bedrails?: None Help needed moving to and from a bed to a  chair (including a wheelchair)?: A Little Help needed standing up from a chair using your arms (e.g., wheelchair or bedside chair)?: A Little Help needed to walk in hospital room?: A Little Help needed climbing 3-5 steps with a railing? : A Little 6 Click Score: 20    End of Session Equipment Utilized During Treatment: Gait belt Activity Tolerance: Patient tolerated treatment well Patient left: in chair;with chair alarm set;with call bell/phone within reach   PT Visit Diagnosis: Difficulty in walking, not elsewhere classified (R26.2)    Time: 7858-8502 PT Time Calculation (min) (ACUTE ONLY): 21 min   Charges:   PT Evaluation $PT Eval Low Complexity: 1 Low          Kenyon Ana, PT  Pager: [phone] Acute Rehab Dept [facility]): [phone]   02/15/2019   [facility] 02/15/2019, 11:46 AM

## 2019-02-15 NOTE — Discharge Summary (Signed)
Physician Discharge Summary  Allen Basista SAY:[id number] DOB: 1950/08/12 DOA: 02/13/2019  PCP: System, Pcp Not In  Admit date: 02/13/2019 Discharge date: 02/15/2019  Admitted From: Home Disposition: Home  Recommendations for Outpatient Follow-up:  1. Follow up with PCP in 1-2 weeks 2. Ambulatory referral to cardiology ordered. 3. Please obtain CBC/BMP/Mag at follow up 4. Please follow up on the following pending results: None  Home Health: PT/OT Equipment/Devices: None  Discharge Condition: Stable CODE STATUS: Full code  Hospital Course: 68 year old female with history of COPD/chronic respiratory failure on 4 LPM, OSA on CPAP, CAD/stent on 01/20/2019, HTN, depression, anxiety and GERD presenting with shortness of breath and frequent nosebleeds.  She was admitted for COPD exacerbation and epistaxis.     In ED, hemodynamically stable.  Febrile to 99.8 (geriatric).  Saturating in 90s on home oxygen.  No documented desaturation.  Hgb 8.9 (the same as previous day).  K3.2.  Bicarb 38.  BNP and high-sensitivity troponin normal.  EKG sinus rhythm with LAFB but no acute ischemic finding.  SARS-CoV-2 negative.  CXR with 8 mm nodular opacity overlying the left midlung and mild cardiomegaly.  Reportedly ENT, Dr. Janace Hoard was consulted by EDP.  Epistaxis resolved with nasal pressure.  She did not tolerate nasal packing due to shortness of breath.  She was started on IV steroid and breathing treatments for COPD exacerbation, and admitted.  Continued on systemic steroid and breathing treatments.  Augmentin added the next day in the setting of mild fever, cough, COPD exacerbation, some sinus pressure and some urinary symptoms.  Two view CXR did not reveal acute finding.  The left lung nodule noted on previous portable CXR is found to be calcified granuloma.   Patient was ambulated on home 4 L and maintain good saturation above 91%.  She required very minimal oxygen to maintain good saturation at rest.   Patient was discharged on prednisone and Augmentin to complete 5 days course.  She is already on LAMA/LABA/ICS.  I encouraged patient's and patient's daughter to use minimal oxygen to maintain saturation between 88 and 92% when she is at rest.  She can still continue 4 L with exertion/ambulation.  Patient daughter states that they have a pulse ox at home.  Hemoglobin dropped to 7.4 the next day.  No obvious evidence of bleeding other than epistaxis that has resolved.  She denies hematochezia or melena.  Anemia panel consistent with iron deficiency.  Transfused 1 unit with appropriate response.  Hemoglobin remained stable at 9.4 prior to discharge. Discharged on oral iron.  Patient was evaluated by PT/OT who recommended home health PT/OT.  This has been ordered on discharge. See individual problem list below for more.  Discharge Diagnoses:  Shortness of breath due to acute COPD exacerbation: On 4 L continuously at home.  Elevated bicarb on BMP suggestive for CO2 retention likely due to high level of oxygen at home.  Portable x-ray with 8 mm lung nodule over LML which was found to be calcified granuloma on repeat two-view chest x-ray. -Discharged on prednisone, Augmentin, LAMA/LABA/ICS and PRN albuterol. -Recommended minimal oxygen to maintain saturation between 88% and 92% when at rest.  Can still use 4 L with ambulation and exertion.  She is also on BiPAP at home. -Home health PT/OT ordered. -Case management consulted for PCP/cardiology.  Patient recently moved from [place] to [place] to live with her daughter.   Epistaxis: Likely due to high level oxygen use. -Discussed minimizing oxygen as above.   Iron deficiency anemia:  Likely due to ongoing epistaxis.  She denies melena or hematochezia.  Hemoglobin dropped to 7.4.  Anemia panel consistent with iron deficiency.  Received 1 unit with appropriate response.  Hgb remained stable at 9.4 the next day.  -Discharged on home iron.  Patient had no  further epistaxis here.  Recent CAD/stent on 01/20/2019: No anginal symptoms.  EKG sinus rhythm with LAFB but no acute ischemic finding.  High-sensitivity troponin negative.  BNP negative as well. -Continue cardiac meds including Brilinta, aspirin and statin. -Patient recently moved from [place].  Ambulatory referral to cardiology order. -Case management consulted for assistance as well.  Essential hypertension:  -Discharged on home medications.  Hypokalemia: Resolved.  Depression/anxiety: -Added BuSpar at 5 mg 3 times daily. -Continue home Lexapro.  Lung nodule: Noted on portable CXR and found to be calcified granuloma on repeat two-view CXR.  Discharge Instructions  Discharge Instructions    Ambulatory referral to Cardiology   Complete by: As directed    Coronary artery disease, stent placement in July in [place], recently moved to area and needs local cardiologist   Call MD for:  difficulty breathing, headache or visual disturbances   Complete by: As directed    Call MD for:  persistant dizziness or light-headedness   Complete by: As directed    Call MD for:  persistant nausea and vomiting   Complete by: As directed    Call MD for:  severe uncontrolled pain   Complete by: As directed    Call MD for:  temperature >100.4   Complete by: As directed    Diet - low sodium heart healthy   Complete by: As directed    Discharge instructions   Complete by: As directed    It has been a pleasure taking care of you! You were admitted with shortness of breath and nosebleed.  No shortness of breath is likely due to his COPD.  Will be treated you with steroid, antibiotics and breathing treatments.  With that, your symptoms improved.  We are discharging you on more steroid (prednisone), antibiotics and your home breathing treatments.  You may use 4 L of oxygen when you are up and walking.  Otherwise, we strongly recommend using minimum oxygen keep oxygen saturation between 88% and 92%.  Too  much oxygen is not good for you COPD as it causes carbon dioxide retention which can cause confusion and difficulty breathing.  It can also cause dry nose and bleeding.  Please review your new medication list and the directions before you take your medications.  Take care,   Face-to-face encounter (required for Medicare/Medicaid patients)   Complete by: As directed    I Milagros Lollichard S Dykstra certify that this patient is under my care and that I, or a nurse practitioner or physician's assistant working with me, had a face-to-face encounter that meets the physician face-to-face encounter requirements with this patient on 02/13/2019. The encounter with the patient was in whole, or in part for the following medical condition(s) which is the primary reason for home health care (List medical condition): COPD, coronary artery disease   The encounter with the patient was in whole, or in part, for the following medical condition, which is the primary reason for home health care: COPD, coronary artery disease management   I certify that, based on my findings, the following services are medically necessary home health services:  Nursing Physical therapy     Reason for Medically Necessary Home Health Services:  Skilled Nursing- Teaching of Disease  Process/Symptom Management Skilled Nursing- Change/Decline in Patient Status     My clinical findings support the need for the above services:  Shortness of breath with activity Unable to leave home safely without assistance and/or assistive device     Further, I certify that my clinical findings support that this patient is homebound due to:  Shortness of Breath with activity Ambulates short distances less than 300 feet     Home Health   Complete by: As directed    Severe COPD, recent MI s/p stent placement, Disease management   To provide the following care/treatments:  PT OT Home Health Aide RN Social work     Increase activity slowly   Complete by: As  directed      Allergies as of 02/15/2019   No Known Allergies     Medication List    TAKE these medications   amoxicillin-clavulanate 875-125 MG tablet Commonly known as: AUGMENTIN Take 1 tablet by mouth every 12 (twelve) hours.   aspirin EC 81 MG tablet Take 81 mg by mouth daily.   atorvastatin 80 MG tablet Commonly known as: LIPITOR Take 80 mg by mouth at bedtime.   budesonide-formoterol 160-4.5 MCG/ACT inhaler Commonly known as: SYMBICORT Inhale 2 puffs into the lungs 2 (two) times daily.   busPIRone 5 MG tablet Commonly known as: BUSPAR Take 1 tablet (5 mg total) by mouth 3 (three) times daily.   escitalopram 20 MG tablet Commonly known as: LEXAPRO Take 20 mg by mouth daily.   ferrous sulfate 325 (65 FE) MG EC tablet Take 325 mg by mouth 2 (two) times daily.   metoprolol succinate 25 MG 24 hr tablet Commonly known as: TOPROL-XL Take 25 mg by mouth 2 (two) times daily.   OXYGEN Inhale 4 L into the lungs continuous. On x 5-6 years as of 2020   pantoprazole 40 MG tablet Commonly known as: PROTONIX Take 40 mg by mouth daily.   predniSONE 50 MG tablet Commonly known as: DELTASONE Take 1 tablet (50 mg total) by mouth daily.   SPIRIVA HANDIHALER IN Inhale 1 puff into the lungs daily.   ticagrelor 90 MG Tabs tablet Commonly known as: BRILINTA Take 90 mg by mouth 2 (two) times daily.   vitamin C 500 MG tablet Commonly known as: ASCORBIC ACID Take 500 mg by mouth daily.      Follow-up Information    [facility] Follow up.   Specialty: Family Medicine Why: appointment scheduled for 02/24/2019 at 2:30 pm, please call [phone] when you arrive in the parking lot and office will come to car to take you in office Contact information: [address] [place] [id number] [phone]       Go to  [facility].   Specialty: Emergency Medicine Why: As needed Contact  information: [address] [id number] [place] [id number] [phone]       [facility].   Specialty: Home Health Services Why: Home Health Physical Therapy, Occupational Therapy, RN, aide, and Social Worker- agency will call to arrange appointment for initial visit Contact information: [address] [place] [address] [phone]           Consultations:  None  Procedures/Studies:  2D Echo: None  Dg Chest 2 View  Result Date: 02/14/2019 CLINICAL DATA:  Shortness of breath EXAM: CHEST - 2 VIEW COMPARISON:  February 13, 2019 FINDINGS: Calcified granuloma seen in the left lung.  Mild opacity in the medial right lung base is stable. The cardiomediastinal silhouette is unchanged. No pneumothorax. No uncalcified nodules or masses. IMPRESSION: 1. The opacity in the medial right lung base is favored to represent vascular crowding and/or atelectasis. There is no correlate on the lateral view to suggest a focal infiltrate. Recommend clinical correlation and attention on follow-up. 2. Calcified granuloma in the left lung. 3. No other abnormalities. Electronically Signed   By: Gerome Samavid  Williams III M.D   On: 02/14/2019 15:50   Dg Chest Portable 1 View  Result Date: 02/13/2019 CLINICAL DATA:  Shortness of breath and fever EXAM: PORTABLE CHEST 1 VIEW COMPARISON:  None. FINDINGS: There is mild cardiomegaly. There is atherosclerotic calcification the aortic knob with a tortuous descending aorta. 8 mm nodular opacity seen overlying the lateral aspect of the lower left lung. The right lung is clear. IMPRESSION: 1. 8 mm nodular opacity seen overlying the left mid lung. If further evaluation is required would recommend CT chest with contrast. 2. Mild cardiomegaly Electronically Signed   By: Jonna ClarkBindu  Avutu M.D.   On: 02/13/2019 12:33      Subjective: No major events overnight of this morning.  She denies chest pain or dyspnea.  Reports some dry cough.   Denies nausea, vomiting, diarrhea or abdominal pain.  Reports some suprapubic pressure and some stress incontinence which appears to be chronic.  Discharge Exam: Vitals:   02/15/19 0448 02/15/19 0700  BP: (!) 145/66   Pulse: 68   Resp: 18   Temp: 97.9 F (36.6 C)   SpO2: 96% 97%    GENERAL: No acute distress.  Appears well.  HEENT: MMM.  Vision and hearing grossly intact.  No epistaxis. NECK: Supple.  No apparent JVD. LUNGS:  No IWOB.  Fair air movement bilaterally.  Mild rhonchi. HEART:  RRR. Heart sounds normal.  ABD: Bowel sounds present. Soft. Non tender.  MSK/EXT:  Moves all extremities. No apparent deformity. No edema bilaterally. SKIN: no apparent skin lesion or wound NEURO: Awake, alert and oriented appropriately.  No gross deficit.  PSYCH: Calm. Normal affect.   The results of significant diagnostics from this hospitalization (including imaging, microbiology, ancillary and laboratory) are listed below for reference.     Microbiology: Recent Results (from the past 240 hour(s))  SARS CORONAVIRUS 2     Status: None   Collection Time: 02/13/19  3:18 PM  Result Value Ref Range Status   SARS Coronavirus 2 NEGATIVE NEGATIVE Final    Comment: (NOTE) SARS-CoV-2 target nucleic acids are NOT DETECTED. The SARS-CoV-2 RNA is generally detectable in upper and lower respiratory specimens during the acute phase of infection. Negative results do not preclude SARS-CoV-2 infection, do not rule out co-infections with other pathogens, and should not be used as the sole basis for treatment or other patient management decisions. Negative results must be combined with clinical observations, patient history, and epidemiological information. The expected result is Negative. Fact Sheet for Patients: HairSlick.nohttps://www.fda.gov/media/138098/download Fact Sheet for Healthcare Providers: quierodirigir.comhttps://www.fda.gov/media/138095/download This test is not yet approved or cleared by the [place] FDA and   has been authorized for detection and/or diagnosis of SARS-CoV-2 by FDA under an Emergency Use Authorization (EUA). This EUA will remain  in effect (meaning this test can be used) for the duration of the COVID-19 declaration under Section 56 4(b)(1) of the Act, 21 U.S.C. section 360bbb-3(b)(1), unless the authorization is terminated or revoked sooner. Performed at [facility], [address] [place], [place] [address]  SARS Coronavirus 2 [facility] order, Performed in [facility] hospital lab) Nasopharyngeal Nasopharyngeal Swab     Status: None   Collection Time: 02/13/19  7:54 PM   Specimen: Nasopharyngeal Swab  Result Value Ref Range Status   SARS Coronavirus 2 NEGATIVE NEGATIVE Final    Comment: (NOTE) If result is NEGATIVE SARS-CoV-2 target nucleic acids are NOT DETECTED. The SARS-CoV-2 RNA is generally detectable in upper and lower  respiratory specimens during the acute phase of infection. The lowest  concentration of SARS-CoV-2 viral copies this assay can detect is 250  copies / mL. A negative result does not preclude SARS-CoV-2 infection  and should not be used as the sole basis for treatment or other  patient management decisions.  A negative result may occur with  improper specimen collection / handling, submission of specimen other  than nasopharyngeal swab, presence of viral mutation(s) within the  areas targeted by this assay, and inadequate number of viral copies  (<250 copies / mL). A negative result must be combined with clinical  observations, patient history, and epidemiological information. If result is POSITIVE SARS-CoV-2 target nucleic acids are DETECTED. The SARS-CoV-2 RNA is generally detectable in upper and lower  respiratory specimens dur ing the acute phase of infection.  Positive  results are indicative of active infection with SARS-CoV-2.  Clinical  correlation with patient history and other diagnostic information is  necessary to determine  patient infection status.  Positive results do  not rule out bacterial infection or co-infection with other viruses. If result is PRESUMPTIVE POSTIVE SARS-CoV-2 nucleic acids MAY BE PRESENT.   A presumptive positive result was obtained on the submitted specimen  and confirmed on repeat testing.  While 2019 novel coronavirus  (SARS-CoV-2) nucleic acids may be present in the submitted sample  additional confirmatory testing may be necessary for epidemiological  and / or clinical management purposes  to differentiate between  SARS-CoV-2 and other Sarbecovirus currently known to infect humans.  If clinically indicated additional testing with an alternate test  methodology [phone]) is advised. The SARS-CoV-2 RNA is generally  detectable in upper and lower respiratory sp ecimens during the acute  phase of infection. The expected result is Negative. Fact Sheet for Patients:  BoilerBrush.com.cy Fact Sheet for Healthcare Providers: https://pope.com/ This test is not yet approved or cleared by the [place] FDA and has been authorized for detection and/or diagnosis of SARS-CoV-2 by FDA under an Emergency Use Authorization (EUA).  This EUA will remain in effect (meaning this test can be used) for the duration of the COVID-19 declaration under Section 564(b)(1) of the Act, 21 U.S.C. section 360bbb-3(b)(1), unless the authorization is terminated or revoked sooner. Performed at [facility], [address], [place], [place] [address]      Labs: BNP (last 3 results) Recent Labs    02/13/19 1209  BNP 50.8   Basic Metabolic Panel: Recent Labs  Lab 02/12/19 1457 02/13/19 1209 02/14/19 0452 02/15/19 0442  NA 140 142 139 142  K 3.7 3.2* 3.6 3.7  CL 94* 94* 94* 98  CO2 37* 38* 35* 35*  GLUCOSE 129* 146* 172* 165*  BUN 15 13 20  24*  CREATININE 0.68 0.65 0.53 0.59  CALCIUM 9.2 9.0 8.6* 8.9  MG  --   --  1.8 1.9    Liver Function Tests: Recent Labs  Lab 02/12/19 1457 02/14/19 0452  AST 17 18  ALT 16 20  ALKPHOS  --  66  BILITOT 0.7 0.5  PROT  6.1 6.2*  ALBUMIN  --  3.1*   No results for input(s): LIPASE, AMYLASE in the last 168 hours. No results for input(s): AMMONIA in the last 168 hours. CBC: Recent Labs  Lab 02/12/19 1457 02/13/19 1209 02/14/19 0452 02/14/19 1613 02/15/19 0442  WBC 7.7 7.9 7.0  --  12.2*  NEUTROABS 6,568 6.8 6.3  --   --   HGB 8.9* 8.9* 7.4* 9.7* 9.4*  HCT 29.2* 31.4* 26.2* 32.6* 32.5*  MCV 93.0 101.0* 100.0  --  98.5  PLT 323 290 300  --  380   Cardiac Enzymes: No results for input(s): CKTOTAL, CKMB, CKMBINDEX, TROPONINI in the last 168 hours. BNP: Invalid input(s): POCBNP CBG: No results for input(s): GLUCAP in the last 168 hours. D-Dimer No results for input(s): DDIMER in the last 72 hours. Hgb A1c No results for input(s): HGBA1C in the last 72 hours. Lipid Profile No results for input(s): CHOL, HDL, LDLCALC, TRIG, CHOLHDL, LDLDIRECT in the last 72 hours. Thyroid function studies Recent Labs    02/12/19 1457  TSH 0.94   Anemia work up Recent Labs    02/14/19 0452 02/14/19 0752 02/14/19 0808  VITAMINB12 238 229  --   FOLATE  --   --  7.8  FERRITIN  --  34  --   TIBC  --  277  --   IRON  --  27*  --   RETICCTPCT  --  2.4  --    Urinalysis No results found for: COLORURINE, APPEARANCEUR, LABSPEC, PHURINE, GLUCOSEU, HGBUR, BILIRUBINUR, KETONESUR, PROTEINUR, UROBILINOGEN, NITRITE, LEUKOCYTESUR Sepsis Labs Invalid input(s): PROCALCITONIN,  WBC,  LACTICIDVEN   The above assessment and plans and recommendations were discussed with patient and patient's daughter prior to discharge.  Time coordinating discharge: 45 minutes  SIGNED:  Almon Hercules, MD  [facility] 02/15/2019, 9:55 AM  If 7PM-7AM, please contact night-coverage [email or web address] Password [email or web address]

## 2019-02-15 NOTE — TOC Progression Note (Signed)
Transition of Care [facility]) - Progression Note    Patient Details  Name: Debra Mcgee MRN: [id number] Date of Birth: 1950/09/20  [facility]) CM/SW Contact  Joaquin Courts, RN Phone Number: 02/15/2019, 11:31 AM  Clinical Narrative:    CM spoke with patient and her daughter Debra Mcgee. Patient is to received services from [facility] for [facility] and to follow-up with pcp on 8/18.    Expected Discharge Plan: [place] Barriers to Discharge: No Barriers Identified  Expected Discharge Plan and Services Expected Discharge Plan: [place] Choice: [place] arrangements for the past 2 months: Single Family Home Expected Discharge Date: 02/15/19                         HH Arranged: RN, PT, OT, Nurse's Aide, Disease Management [place] Agency: [facility] Date [place]: 02/13/19 Time [place]: 1432 Representative spoke with at [place]: Burlene Arnt   Social Determinants of Health (SDOH) Interventions    Readmission Risk Interventions No flowsheet data found.

## 2019-02-16 LAB — FOLATE RBC
Folate, Hemolysate: 274 ng/mL
Folate, RBC: 1096 ng/mL (ref >498)
Hematocrit: 25 % — ABNORMAL LOW (ref 34.0–46.6)

## 2019-02-23 ENCOUNTER — Telehealth: Payer: Self-pay | Admitting: Family Medicine

## 2019-02-23 NOTE — Telephone Encounter (Signed)
FYI - Pt scheduled for NP visit tomorrow.

## 2019-02-23 NOTE — Telephone Encounter (Signed)
Caller name: Mr. Gloriann Loan  Relation to pt: OT from [facility]  Call back number: [phone]   Reason for call:  Verbal orders 1x 5 to address OT, requesting orders/rx for rollator please fax [phone]

## 2019-02-24 ENCOUNTER — Encounter: Payer: Self-pay | Admitting: Family Medicine

## 2019-02-24 NOTE — Telephone Encounter (Signed)
I will be glad to do so after I meat pt, she has not established. Thanks, BJ

## 2019-02-24 NOTE — Telephone Encounter (Signed)
Lm for Bell to return phone call.

## 2019-02-24 NOTE — Patient Instructions (Signed)
There are no preventive care reminders to display for this patient.  No flowsheet data found.  

## 2019-02-24 NOTE — Telephone Encounter (Signed)
Message sent to Dr. Jordan for review and approval. 

## 2019-02-24 NOTE — Telephone Encounter (Signed)
Lm for Mr.Bell to return phone call.

## 2019-02-25 NOTE — Telephone Encounter (Signed)
John called back.  States that he is returning a call.  Also, they have this pt's last name as Graser no "s" in it at all.  Per [facility] send CRM.

## 2019-02-25 NOTE — Telephone Encounter (Signed)
Spoke with Clair Gulling from [place] at [facility] and informed him that patient cancelled new patient appointment and that orders cannot be giving until patient is seen. Clair Gulling verbalized understanding and stated that he would contact patient.  Nothing further needed at this time.

## 2019-02-26 ENCOUNTER — Ambulatory Visit: Payer: Medicare Other | Admitting: Nurse Practitioner

## 2019-03-06 ENCOUNTER — Ambulatory Visit: Payer: Self-pay | Admitting: *Deleted

## 2019-03-06 NOTE — Telephone Encounter (Signed)
Pt's daughter called in, Debra Mcgee.   Her mother is having trouble with her breathing that has been going on for a week but is worse this morning.    Pt has COPD and is on O2.   Pt had an appt with Dr. Martinique at the [facility] office however she did not have her ID with her so they would not see her for her get established appt.   She has an appt this coming Tuesday for a get established appt with Dr. Martinique.  Her mother has a history of MI ad COPD.   Debra Mcgee is wondering should I just take her to the ED?    I let her know with her history and difficulty breathing that is worse especially with her having COPD that it is best she go to the ED.    Her mother is on blood thinners and has had a bad nose bleed and ended up being admitted at [facility] mentioned yesterday her mother blew her nose and got a blood clot out and had some blood in the back of her throat.    I let her know to mention that to the ED doctor also.  Debra Mcgee, daughter thanked me very much for my help and said,   "I'm just at my witt's end with my mother".   "I think she's depressed from the passing of my Dad and she won't do anything for herself".   "I'm having to do everything for her".    "I have a very stressful job I'm doing from home as well as home school and it's just a lot for me right now".    I gave her reassurance and was a listening ear for her.    She thanked me and said she would take her mother to [facility] ED now.   "We will see y'all in the office on Tuesday".       Reason for Disposition . [1] Longstanding difficulty breathing (e.g., CHF, COPD, emphysema) AND [2] WORSE than normal  Answer Assessment - Initial Assessment Questions 1. RESPIRATORY STATUS: "Describe your breathing?" (e.g., wheezing, shortness of breath, unable to speak, severe coughing)      We are from [place].   My mother had an MI.   Went to [place], [place] for the MI.   Had a stent put in.        She has had a bad nose  bleed and went to [facility] ED.     Daughter Debra Mcgee calling in.     The case worker referred her to your office.   She did not have her ID so she wasn't seen in the office. 2. ONSET: "When did this breathing problem begin?"      All week but today it's worse.    She has an appt on Tuesday.    Mother has COPD.   She is on O2 4L because can't breath.    3. PATTERN "Does the difficult breathing come and go, or has it been constant since it started?"      Constant       4. SEVERITY: "How bad is your breathing?" (e.g., mild, moderate, severe)    - MILD: No SOB at rest, mild SOB with walking, speaks normally in sentences, can lay down, no retractions, pulse < 100.    - MODERATE: SOB at rest, SOB with minimal exertion and prefers to sit, cannot lie down flat, speaks in phrases,  mild retractions, audible wheezing, pulse 100-120.    - SEVERE: Very SOB at rest, speaks in single words, struggling to breathe, sitting hunched forward, retractions, pulse > 120      Severe shortness of breath.   She doesn't do anything by herself.   I have to do everything for her.   She is depressed from losing my Dad. 5. RECURRENT SYMPTOM: "Have you had difficulty breathing before?" If so, ask: "When was the last time?" and "What happened that time?"      Has COPD 6. CARDIAC HISTORY: "Do you have any history of heart disease?" (e.g., heart attack, angina, bypass surgery, angioplasty)      Yes   Had an MI that was treated in [place] 7. LUNG HISTORY: "Do you have any history of lung disease?"  (e.g., pulmonary embolus, asthma, emphysema)     COPD 8. CAUSE: "What do you think is causing the breathing problem?"      She COPD 9. OTHER SYMPTOMS: "Do you have any other symptoms? (e.g., dizziness, runny nose, cough, chest pain, fever)     She is coughing.    My mother thinks her nose may be bleeding again.   She blew her nose yesterday and got a clot out of the back of her throat.   She is on a blood thinner. 10.  PREGNANCY: "Is there any chance you are pregnant?" "When was your last menstrual period?"       N/A 11. TRAVEL: "Have you traveled out of the country in the last month?" (e.g., travel history, exposures)       No  Protocols used: BREATHING DIFFICULTY-A-AH

## 2019-03-10 ENCOUNTER — Telehealth: Payer: Self-pay

## 2019-03-10 ENCOUNTER — Encounter (HOSPITAL_COMMUNITY): Payer: Self-pay

## 2019-03-10 NOTE — Telephone Encounter (Signed)
Copied from [place] [id number]. Topic: General - Other >> Mar 10, 2019 10:55 AM Burchel, Abbi R wrote: Pt has NP appt tomorrow and pt's daughter called to confirm that pt's ID that was faxed over is acceptable.  Please call pt's daughter Nira Conn) and confirm before appt tomorrow.  [phone]    I called Nira Conn and let her know that we have received the patients DL and have scanned it into her chart.

## 2019-03-11 ENCOUNTER — Ambulatory Visit (INDEPENDENT_AMBULATORY_CARE_PROVIDER_SITE_OTHER): Payer: Medicare Other | Admitting: Family Medicine

## 2019-03-11 ENCOUNTER — Encounter: Payer: Self-pay | Admitting: Family Medicine

## 2019-03-11 ENCOUNTER — Other Ambulatory Visit: Payer: Self-pay

## 2019-03-11 VITALS — BP 126/70 | HR 68 | Temp 97.6°F | Resp 16 | Ht <= 58 in | Wt 132.2 lb

## 2019-03-11 DIAGNOSIS — F419 Anxiety disorder, unspecified: Secondary | ICD-10-CM

## 2019-03-11 DIAGNOSIS — E785 Hyperlipidemia, unspecified: Secondary | ICD-10-CM | POA: Diagnosis not present

## 2019-03-11 DIAGNOSIS — I2510 Atherosclerotic heart disease of native coronary artery without angina pectoris: Secondary | ICD-10-CM

## 2019-03-11 DIAGNOSIS — J441 Chronic obstructive pulmonary disease with (acute) exacerbation: Secondary | ICD-10-CM

## 2019-03-11 DIAGNOSIS — I25119 Atherosclerotic heart disease of native coronary artery with unspecified angina pectoris: Secondary | ICD-10-CM

## 2019-03-11 DIAGNOSIS — D509 Iron deficiency anemia, unspecified: Secondary | ICD-10-CM

## 2019-03-11 DIAGNOSIS — I10 Essential (primary) hypertension: Secondary | ICD-10-CM | POA: Diagnosis not present

## 2019-03-11 DIAGNOSIS — J9611 Chronic respiratory failure with hypoxia: Secondary | ICD-10-CM | POA: Insufficient documentation

## 2019-03-11 LAB — BASIC METABOLIC PANEL
BUN: 10 mg/dL (ref 6–23)
CO2: 39 mEq/L — ABNORMAL HIGH (ref 19–32)
Calcium: 9.4 mg/dL (ref 8.4–10.5)
Chloride: 99 mEq/L (ref 96–112)
Creatinine, Ser: 0.48 mg/dL (ref 0.40–1.20)
GFR: 128.45 mL/min (ref >60.00)
Glucose, Bld: 100 mg/dL — ABNORMAL HIGH (ref 70–99)
Potassium: 3.7 mEq/L (ref 3.5–5.1)
Sodium: 144 mEq/L (ref 135–145)

## 2019-03-11 LAB — CBC
HCT: 32.6 % — ABNORMAL LOW (ref 36.0–46.0)
Hemoglobin: 10.3 g/dL — ABNORMAL LOW (ref 12.0–15.0)
MCHC: 31.5 g/dL (ref 30.0–36.0)
MCV: 93 fl (ref 78.0–100.0)
Platelets: 251 10*3/uL (ref 150.0–400.0)
RBC: 3.5 Mil/uL — ABNORMAL LOW (ref 3.87–5.11)
RDW: 15.6 % — ABNORMAL HIGH (ref 11.5–15.5)
WBC: 8.5 10*3/uL (ref 4.0–10.5)

## 2019-03-11 MED ORDER — CLONAZEPAM 0.5 MG PO TABS: 0.2500 mg | ORAL_TABLET | Freq: Two times a day (BID) | ORAL | 1 refills | Status: AC | PRN

## 2019-03-11 NOTE — Progress Notes (Signed)
HPI:  Debra Chanetta MarshallSue Mcgee is a 68 y.o. female, who is here today with her daughter to follow   Her PCP is in [place]. She is staying with her daughter here in [place], the plan is that after she recovers she can go back to her house at [place].  Chronic medical problems: COPD/chronic respiratory failure on supplemental oxygen, 4 LPM, OSA on CPAP, CAD/stent on 01/20/2019, hypertension, depression, anxiety, past history of alcoholism, and GERD among some.  She was recently hospitalized, from 02/12/2021 02/15/2019 due to COPD exacerbation and epistaxis. She initially presented to the ER because of epistaxis but the second day of hospitalization she developed mild fever, cough, and worsening COPD symptoms.  Rehospitalization ENT was consulted, epistaxis resolved with nasal pressure, she did not tolerate nasal packing.  She was discharged on prednisone and Augmentin to complete 5 days course.  Because she does not have a PCP in town, she has not been able to start PT at home. She needs a walker for transfer. Continue O2 supplementation.  According to daughter she is supposed to be on 1-2 LPM and can increase it to 3-4 with exertion but she keeps it at 4 LPM all the time and is still complaining about not being able to breathe,reports Os sats of 80% when she has no O2 supplementation. Today she did not have O2 when pulse ox was done.   COPD, currently she is on Symbicort 160-4.5 mcg 2 puffs twice daily and albuterol inhaler 2 puffs every 4-6 hours as needed.  She follows with pulmonologist in [place]. Respiratory symptoms back to her baseline.  She denies fever, chills, or sore throat.  CXR on 02/13/2019 showed 8 mm nodule opacity overlying the left mid lung, mild cardiomegaly. CXR repeated on 02/14/2019: The opacity in the medial right lung base is favored to represent a vascular crowding and/or atelectasis.  Calcified granuloma in the left lung. Decreased appetite, so she has  lost weight.  Anemia, according to the daughter she has "always" been anemic. She has not noted gross hematuria, blood in the stool, or melena. Epistaxis has not reoccurred. During hospitalization she received a transfusion note 1 unit of packed RBCs. Hemoglobin went from 7.4 to 9.4. Currently she is on iron supplementation.  Lab Results  Component Value Date   WBC 12.2 (H) 02/15/2019   HGB 9.4 (L) 02/15/2019   HCT 32.5 (L) 02/15/2019   MCV 98.5 02/15/2019   PLT 380 02/15/2019   Hypertension and CAD: She denies unusual headache, visual changes, chest pain, palpitations, orthopnea, PND, focal neurologic deficit, or edema. She does not check BP regularly. Currently she is on metoprolol tartrate 25 mg twice daily. Aspirin 81 mg daily and atorvastatin 80 mg daily. She is also on Brilinta 90 mg twice daily.   Lab Results  Component Value Date   CREATININE 0.59 02/15/2019   BUN 24 (H) 02/15/2019   NA 142 02/15/2019   K 3.7 02/15/2019   CL 98 02/15/2019   CO2 35 (H) 02/15/2019    Anxiety and depression: According to patient for many years she has been on clonazepam 1/2 tablet twice daily. Currently she is on Lexapro 20 mg daily. Depression is exacerbated by the death of her husband a few years ago. During hospitalization BuSpar was increased from 7.5 mg twice daily to 5 mg 3 times daily, she does not feel it is helping with anxiety. She would like to resume clonazepam.   She was also on chronic opioids  for pain management.   Review of Systems  Constitutional: Positive for activity change and fatigue.  HENT: Negative for mouth sores and trouble swallowing.   Respiratory: Positive for shortness of breath. Negative for cough and wheezing.   Gastrointestinal: Negative for abdominal pain, nausea and vomiting.  Endocrine: Negative for cold intolerance and heat intolerance.  Genitourinary: Negative for decreased urine volume, dysuria and hematuria.  Musculoskeletal: Positive for  arthralgias and gait problem.  Neurological: Negative for syncope and facial asymmetry.  Psychiatric/Behavioral: Negative for hallucinations. The patient is nervous/anxious.   Rest see pertinent positives and negatives per HPI.   Current Outpatient Medications on File Prior to Visit  Medication Sig Dispense Refill  . amoxicillin-clavulanate (AUGMENTIN) 875-125 MG tablet Take 1 tablet by mouth every 12 (twelve) hours. 10 tablet 0  . aspirin EC 81 MG tablet Take 81 mg by mouth daily.    Marland Kitchen. atorvastatin (LIPITOR) 80 MG tablet Take 80 mg by mouth at bedtime.    . budesonide-formoterol (SYMBICORT) 160-4.5 MCG/ACT inhaler Inhale 2 puffs into the lungs 2 (two) times daily.    . busPIRone (BUSPAR) 5 MG tablet Take 1 tablet (5 mg total) by mouth 3 (three) times daily. 90 tablet 1  . escitalopram (LEXAPRO) 20 MG tablet Take 20 mg by mouth daily.    . ferrous sulfate 325 (65 FE) MG EC tablet Take 325 mg by mouth 2 (two) times daily.    . metoprolol succinate (TOPROL-XL) 25 MG 24 hr tablet Take 25 mg by mouth 2 (two) times daily.    . pantoprazole (PROTONIX) 40 MG tablet Take 40 mg by mouth daily.    . predniSONE (DELTASONE) 50 MG tablet Take 50 mg by mouth daily with breakfast.    . ticagrelor (BRILINTA) 90 MG TABS tablet Take 90 mg by mouth 2 (two) times daily.    . vitamin C (ASCORBIC ACID) 500 MG tablet Take 500 mg by mouth daily.    . OXYGEN Inhale 4 L into the lungs continuous. On x 5-6 years as of 2020     No current facility-administered medications on file prior to visit.     Past Medical History:  Diagnosis Date  . Anemia   . Anxiety   . COPD (chronic obstructive pulmonary disease) (HCC)    Stage 4  . Depression   . Heart attack (HCC)   . High blood pressure    No Known Allergies  Family History  Problem Relation Age of Onset  . COPD Mother   . Depression Mother   . Alcohol abuse Sister   . Depression Sister   . Drug abuse Sister   . Early death Sister   . Mental illness  Sister   . Mental retardation Sister   . Mental illness Brother   . Mental retardation Brother   . Mental illness Sister   . Mental retardation Sister   . COPD Sister   . Depression Sister     Social History   Socioeconomic History  . Marital status: Widowed    Spouse name: Not on file  . Number of children: Not on file  . Years of education: Not on file  . Highest education level: Not on file  Occupational History  . Not on file  Social Needs  . Financial resource strain: Not on file  . Food insecurity    Worry: Not on file    Inability: Not on file  . Transportation needs    Medical: Not on file  Non-medical: Not on file  Tobacco Use  . Smoking status: Former Smoker    Packs/day: 1.00    Years: 48.00    Pack years: 48.00    Types: Cigarettes  . Smokeless tobacco: Never Used  . Tobacco comment: Quit at age 50  Substance and Sexual Activity  . Alcohol use: Not Currently    Alcohol/week: 4.0 - 5.0 standard drinks    Types: 4 - 5 Cans of beer per week  . Drug use: Never  . Sexual activity: Not Currently  Lifestyle  . Physical activity    Days per week: Not on file    Minutes per session: Not on file  . Stress: Not on file  Relationships  . Social Musician on phone: Not on file    Gets together: Not on file    Attends religious service: Not on file    Active member of club or organization: Not on file    Attends meetings of clubs or organizations: Not on file    Relationship status: Not on file  Other Topics Concern  . Not on file  Social History Narrative   ** Merged History Encounter **       Social History  Diet? Sweets  One meal  A day   Do you drink/eat things with caffeine?   Marital status?               widowed                     What year were you married?   Do you live in a house, apartment, assisted living, condo, trailer, e   tc.? house  Is it one or more stories?  How many persons live in your home?   Do you have any  pets in your home? (please list) yes small dog  Highest level of education completed? High school   Current or past profession: postal service   Do    you exercise?            no                          Type & how often?   Advanced Directives  Do you have a living will? No   Do you have a DNR form?                                  If not, do you want to discuss one? yes  Do you have signed POA   /HPOA for forms? No   Functional Status  Do you have difficulty bathing or dressing yourself? yes  Do you have difficulty preparing food or eating? Yes   Do you have difficulty managing your medications? Yes   Do you have difficulty managing yo   ur finances? No   Do you have difficulty affording your medications? No       Vitals:   03/11/19 1204  BP: 126/70  Pulse: 68  Resp: 16  Temp: 97.6 F (36.4 C)  SpO2: 92%    Body mass index is 27.64 kg/m.  Physical Exam  Nursing note and vitals reviewed. Constitutional: She is oriented to person, place, and time. She appears well-developed. No distress.  HENT:  Head: Normocephalic and atraumatic.  Mouth/Throat: Oropharynx is clear and moist and mucous membranes are normal.  Edentulous.  Eyes: Pupils are equal, round, and reactive to light. Conjunctivae are normal.  Cardiovascular: Normal rate and regular rhythm.  No murmur heard. Pulses:      Dorsalis pedis pulses are 2+ on the right side and 2+ on the left side.  Respiratory: Effort normal and breath sounds normal. No respiratory distress.  GI: Soft. She exhibits no mass. There is no abdominal tenderness.  Musculoskeletal:        General: No edema.  Lymphadenopathy:    She has no cervical adenopathy.  Neurological: She is alert and oriented to person, place, and time. She has normal strength. She displays tremor (Mild hands tremor). No cranial nerve deficit.  Uses a walker for transfer.  Skin: Skin is warm. No rash noted. No erythema.  Psychiatric: Her mood appears  anxious.  Fairly groomed, good eye contact.   ASSESSMENT AND PLAN:  Ms. Jazzmin was seen today for establish care.  Diagnoses and all orders for this visit:  Lab Results  Component Value Date   WBC 8.5 03/11/2019   HGB 10.3 (L) 03/11/2019   HCT 32.6 (L) 03/11/2019   MCV 93.0 03/11/2019   PLT 251.0 03/11/2019   Lab Results  Component Value Date   CREATININE 0.48 03/11/2019   BUN 10 03/11/2019   NA 144 03/11/2019   K 3.7 03/11/2019   CL 99 03/11/2019   CO2 39 (H) 03/11/2019    Essential hypertension BP is adequately controlled. Recommend monitoring BP at home. Continue low-salt diet. Continue metoprolol tartrate 25 mg twice daily.  -     CBC -     Basic metabolic panel  Dyslipidemia Continue atorvastatin 80 mg daily. She will follow-up with her PCP in [place].  Anxiety disorder, unspecified type According to her daughter, she has been on clonazepam for a few years. Anxiety seems to aggravate COPD symptoms, given the fact medication has helped in the past, recommend resuming lorazepam 0.5 mg 1/2 tablet twice daily. We discussed some side effects in detail, including decreasing respiratory drive and addiction. Follow-up in 4 weeks if she is still in town, otherwise she will continue following with her PCP.  -     clonazePAM (KLONOPIN) 0.5 MG tablet; Take 0.5 tablets (0.25 mg total) by mouth 2 (two) times daily as needed for anxiety.  Coronary artery disease involving native heart without angina pectoris, unspecified vessel or lesion type Asymptomatic. Continue atorvastatin 80 mg daily, Aspirin 81 mg daily, and Brilinta 90 mg twice daily. We discussed some side effects of Brilinta. Continue following with cardiologist in [place].  Chronic respiratory failure with hypoxia (HCC) Continue O2 supplementation as needed. Today O2 sat was 92 while she was not on supplemental O2. Recommend using O2 if O2 sat is 89% or less.  COPD exacerbation ([place]) Acute exacerbation  has resolved, she is back to her baseline. Continue Symbicort 160-4.5 mcg 2 puffs twice daily and albuterol inhaler as needed. She follows with pulmonologist in [place].  Iron deficiency anemia, unspecified iron deficiency anemia type It seems to be chronic,?  Related to chronic disease. Continue ferrous sulfate 325 mg daily. Further recommendation will be given according to CBC results.  Home health orders were signed, so she can start PT at home.  Return in about 4 weeks (around 04/08/2019) for anxiety,COPD.     Noretta Frier G. Martinique, MD  [facility]. [place] office.

## 2019-03-11 NOTE — Patient Instructions (Addendum)
A few things to remember from today's visit:   Essential hypertension - Plan: CBC, Basic metabolic panel  Dyslipidemia  Anxiety disorder, unspecified type - Plan: clonazePAM (KLONOPIN) 0.5 MG tablet  O2 sat goal 89% or above.  Today Clonazepam to resume. No changes in rest of meds.  Please be sure medication list is accurate. If a new problem present, please set up appointment sooner than planned today.

## 2019-03-13 ENCOUNTER — Telehealth: Payer: Self-pay | Admitting: Family Medicine

## 2019-03-13 NOTE — Telephone Encounter (Signed)
Caller/Agency: Clair Gulling Bell/ OT with [facility] [place] Number: [phone] [facility] to leave verbal on VM Requesting OT/PT/Skilled Nursing/Social Work/Speech Therapy: OT Frequency: 1x a month for a month, 3 m 1, 1 m1

## 2019-03-17 NOTE — Telephone Encounter (Signed)
Called Jim and LM giving the verbal okay for services

## 2019-03-17 NOTE — Telephone Encounter (Signed)
It is okay to give verbal authorization to requested the services. Thanks, BK

## 2019-03-17 NOTE — Telephone Encounter (Signed)
HH orders initiated at  [place] 03/11/2019. Please advise.

## 2019-04-03 ENCOUNTER — Telehealth: Payer: Self-pay | Admitting: *Deleted

## 2019-04-03 NOTE — Telephone Encounter (Deleted)
Copied from [place] [id number]. Topic: General - Other >> Apr 03, 2019  2:59 PM Celene Kras A wrote: Reason for CRM: Clare Gandy, from [facility], called stating he could not complete his visit today with pt. Please advise.

## 2019-04-03 NOTE — Telephone Encounter (Signed)
Message sent to Dr. Martinique for review. Noted.FYI sent to Dr. Martinique for review. Copied from [place] [id number]. Topic: General - Other >> Apr 03, 2019  2:59 PM Celene Kras A wrote: Reason for CRM: Clare Gandy, from [facility], called stating he could not complete his visit today with pt. Please advise.

## 2020-08-01 IMAGING — DX PORTABLE CHEST - 1 VIEW
1 series · 1 of 1 positions shown · non-contrast
Comparison: None.

CLINICAL DATA: Shortness of breath and fever

EXAM:
PORTABLE CHEST 1 VIEW

[chest ap]
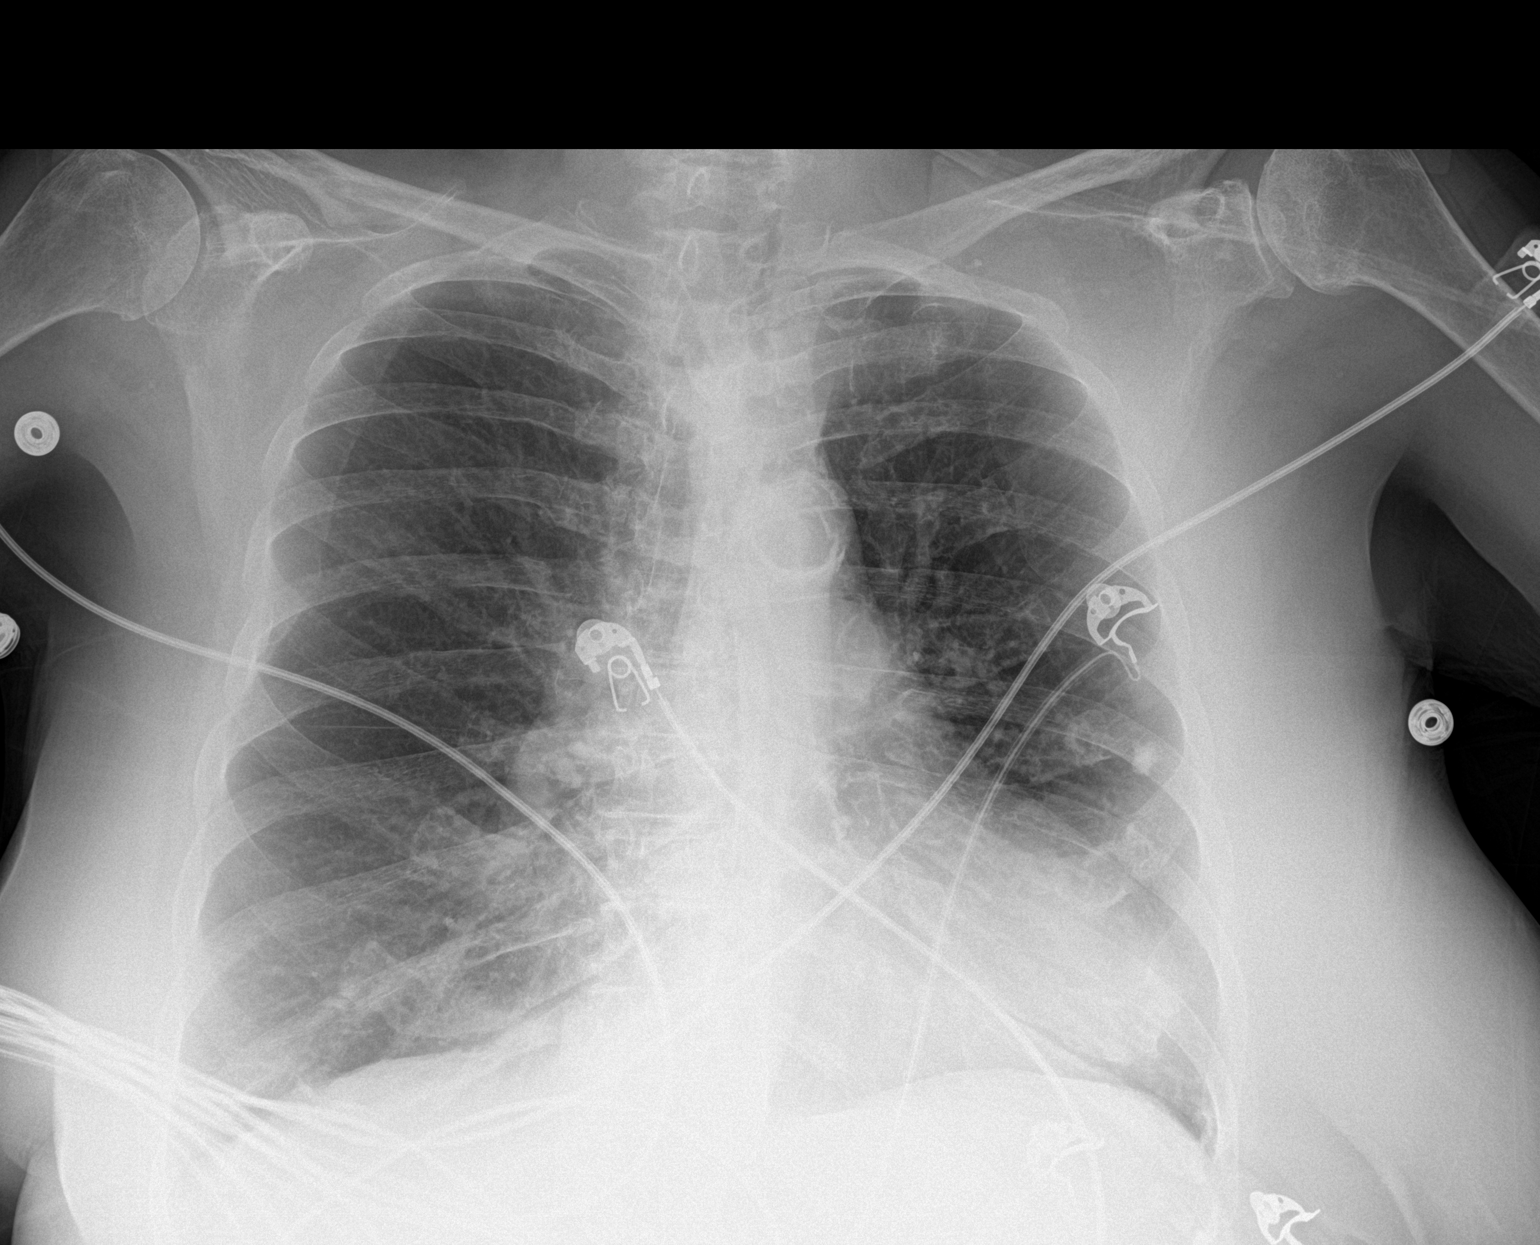

[1 of 1 positions shown; findings below may reference images not displayed]

FINDINGS: There is mild cardiomegaly. There is atherosclerotic calcification
the aortic knob with a tortuous descending aorta. 8 mm nodular
opacity seen overlying the lateral aspect of the lower left lung.
The right lung is clear.
IMPRESSION: 1. 8 mm nodular opacity seen overlying the left mid lung. If further
evaluation is required would recommend CT chest with contrast.
2. Mild cardiomegaly

## 2020-08-02 IMAGING — DX CHEST - 2 VIEW
2 series · 2 of 2 positions shown · non-contrast
Comparison: February 13, 2019

CLINICAL DATA: Shortness of breath

EXAM:
CHEST - 2 VIEW

[chest pa]
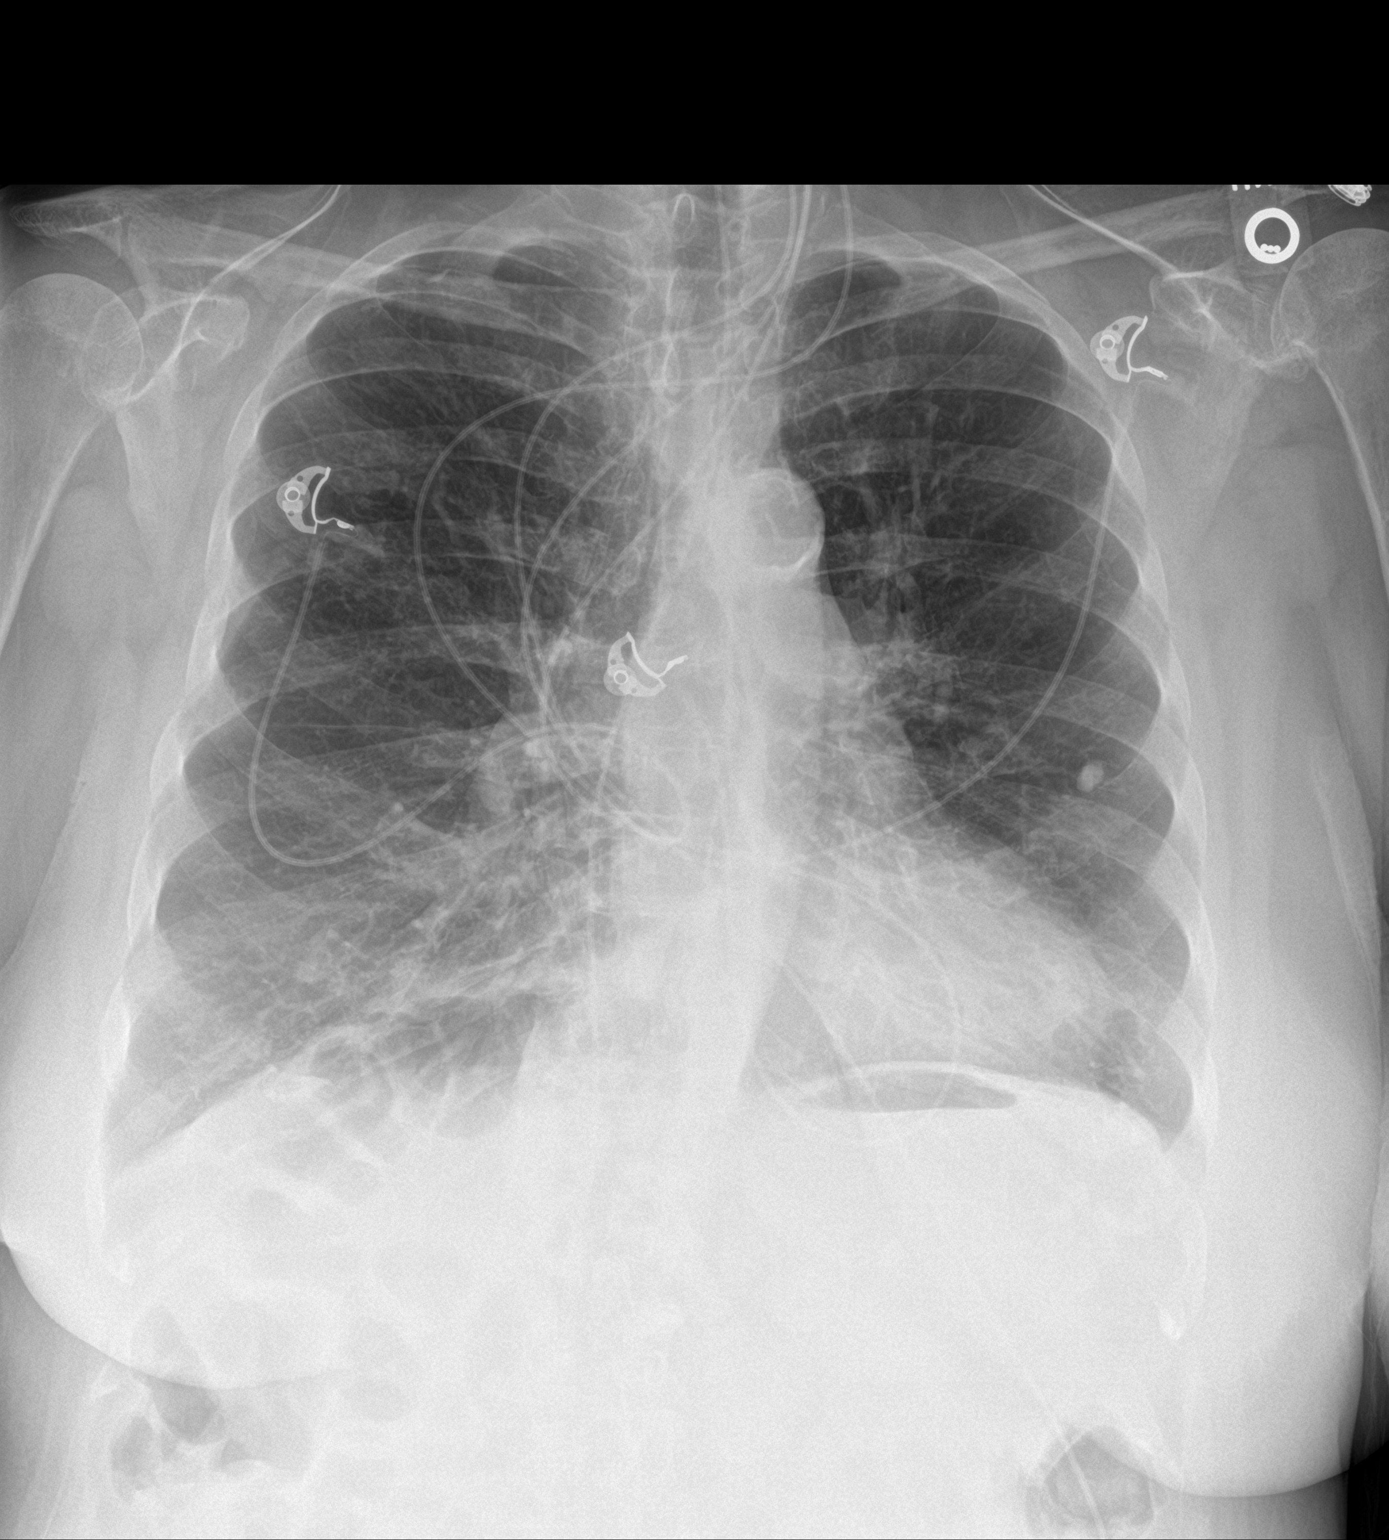

[chest lat]
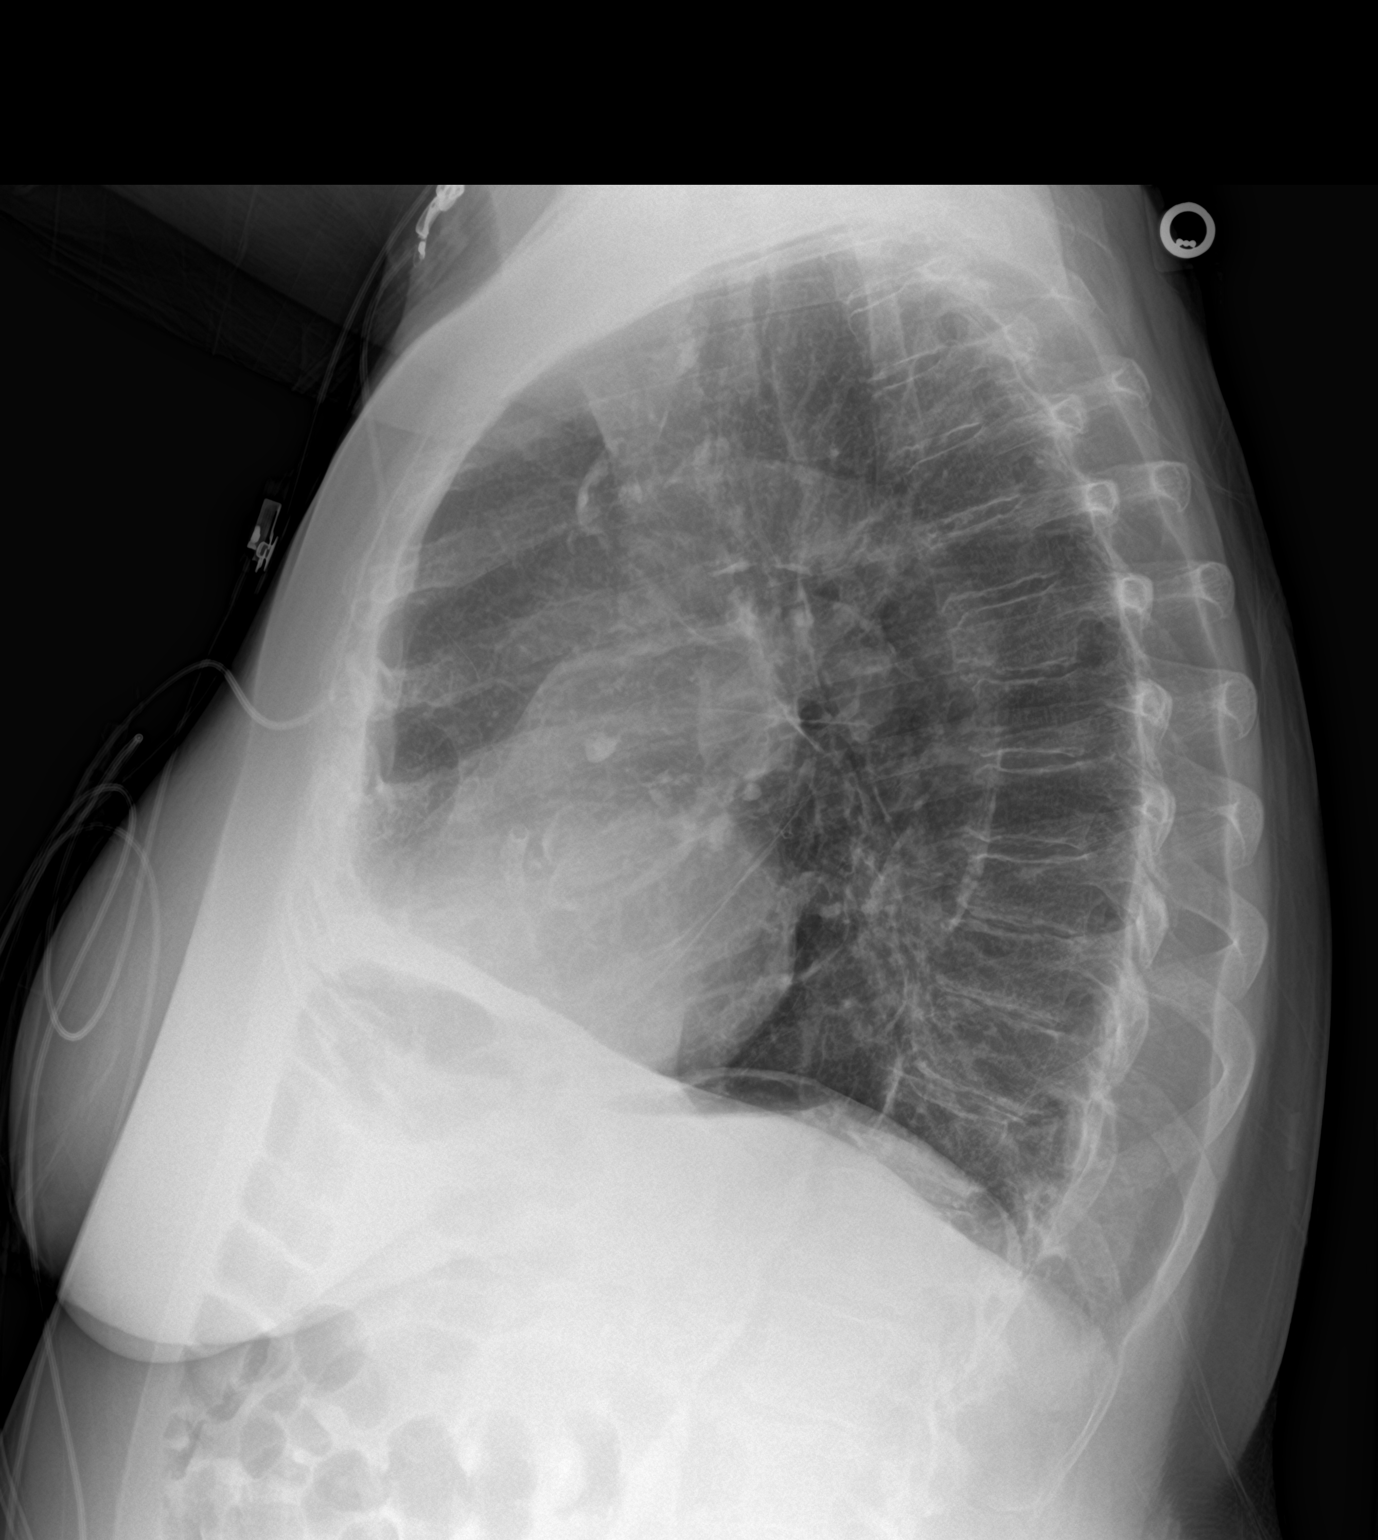

[2 of 2 positions shown; findings below may reference images not displayed]

FINDINGS: Calcified granuloma seen in the left lung. Mild opacity in the
medial right lung base is stable. The cardiomediastinal silhouette
is unchanged. No pneumothorax. No uncalcified nodules or masses.
IMPRESSION: 1. The opacity in the medial right lung base is favored to represent
vascular crowding and/or atelectasis. There is no correlate on the
lateral view to suggest a focal infiltrate. Recommend clinical
correlation and attention on follow-up.
2. Calcified granuloma in the left lung.
3. No other abnormalities.

## 2020-08-16 ENCOUNTER — Telehealth: Payer: Self-pay | Admitting: Family Medicine

## 2020-08-16 NOTE — Telephone Encounter (Signed)
Tried calling to  schedule Medicare Annual Wellness Visit (AWV) either virtually or in office.  Phone disconnected   Last AWV no information  please schedule at anytime with [facility] Nurse Health Advisor 1 or 2   This should be a 45 minute visit. Patient also needs appointment with pcp last appointment 03/11/2019

## 2020-10-05 ENCOUNTER — Telehealth: Payer: Self-pay | Admitting: Family Medicine

## 2020-10-05 NOTE — Telephone Encounter (Signed)
Tried calling patient to  schedule Medicare Annual Wellness Visit (AWV) either virtually or in office.  No answer   awvi per [place] 12/08/11  please schedule at anytime with [facility] Nurse Health Advisor 1 or 2   This should be a 45 minute visit.

## 2020-12-29 ENCOUNTER — Telehealth: Payer: Self-pay | Admitting: Family Medicine

## 2020-12-29 NOTE — Telephone Encounter (Signed)
Left message for patient to call back and schedule Medicare Annual Wellness Visit (AWV) either virtually or in office.   awvi per [place] 12/08/11  please schedule at anytime with [facility] Nurse Health Advisor 1 or 2  Patient also needs appointment with PCP lat appointment 03/11/2019  This should be a 45 minute visit.

## 2021-01-31 ENCOUNTER — Telehealth: Payer: Self-pay | Admitting: Family Medicine

## 2021-01-31 NOTE — Telephone Encounter (Signed)
Left message for patient to call back and schedule Medicare Annual Wellness Visit (AWV) either virtually or in office.   awvi per [place] 12/08/11 please schedule at anytime with [facility] Nurse Health Advisor 1 or 2  Patient also needs appointment with pcp last appointment 03/11/2019   This should be a 45 minute visit.

## 2021-03-01 ENCOUNTER — Telehealth: Payer: Self-pay | Admitting: Family Medicine

## 2021-03-01 NOTE — Telephone Encounter (Signed)
Left message for patient to call back and schedule Medicare Annual Wellness Visit (AWV) either virtually or in office.   Left  my Zachery Conch number [phone]   awvi per [place] 12/08/11  please schedule at anytime with [facility] Nurse Health Advisor 1 or 2  Patient also need appointment with PCP last appointment 03/11/2019   This should be a 45 minute visit.

## 2022-05-21 NOTE — Progress Notes (Signed)
Appointment cancelled - please disregard. 

## 2022-06-12 ENCOUNTER — Telehealth: Payer: Self-pay | Admitting: Family Medicine

## 2022-06-12 NOTE — Telephone Encounter (Signed)
Left message for patient to call back and schedule Medicare Annual Wellness Visit (AWV) either virtually or phone Left  my Debra Mcgee number [phone]   awvi per [place] 12/08/11 please schedule with hannah kim   45 min for awv-i and in office appointments 30 min for awv-s  phone/virtual appointments

## 2022-10-08 DEATH — deceased
